# Patient Record
Sex: Male | Born: 1945 | Race: White | Hispanic: No | Marital: Single | Smoking: Former smoker
Health system: Southern US, Community
[De-identification: ages and names within clinical notes are randomized; demographics above are authoritative.]

## PROBLEM LIST (undated history)

## (undated) DIAGNOSIS — K529 Noninfective gastroenteritis and colitis, unspecified: Secondary | ICD-10-CM

## (undated) DIAGNOSIS — N400 Enlarged prostate without lower urinary tract symptoms: Secondary | ICD-10-CM

## (undated) DIAGNOSIS — E785 Hyperlipidemia, unspecified: Secondary | ICD-10-CM

## (undated) DIAGNOSIS — G4730 Sleep apnea, unspecified: Secondary | ICD-10-CM

## (undated) DIAGNOSIS — C61 Malignant neoplasm of prostate: Secondary | ICD-10-CM

## (undated) DIAGNOSIS — M1990 Unspecified osteoarthritis, unspecified site: Secondary | ICD-10-CM

## (undated) DIAGNOSIS — R972 Elevated prostate specific antigen [PSA]: Secondary | ICD-10-CM

## (undated) DIAGNOSIS — K56609 Unspecified intestinal obstruction, unspecified as to partial versus complete obstruction: Secondary | ICD-10-CM

## (undated) DIAGNOSIS — H9193 Unspecified hearing loss, bilateral: Secondary | ICD-10-CM

## (undated) DIAGNOSIS — Z87891 Personal history of nicotine dependence: Secondary | ICD-10-CM

## (undated) HISTORY — DX: Benign prostatic hyperplasia without lower urinary tract symptoms: N40.0

## (undated) HISTORY — DX: Sleep apnea, unspecified: G47.30

## (undated) HISTORY — PX: ANKLE SURGERY: SHX546

## (undated) HISTORY — DX: Hyperlipidemia, unspecified: E78.5

## (undated) HISTORY — DX: Malignant neoplasm of prostate: C61

## (undated) HISTORY — DX: Noninfective gastroenteritis and colitis, unspecified: K52.9

## (undated) HISTORY — DX: Elevated prostate specific antigen (PSA): R97.20

## (undated) HISTORY — DX: Unspecified osteoarthritis, unspecified site: M19.90

## (undated) HISTORY — DX: Unspecified intestinal obstruction, unspecified as to partial versus complete obstruction: K56.609

## (undated) HISTORY — DX: Personal history of nicotine dependence: Z87.891

---

## 1978-08-13 HISTORY — PX: COLON RESECTION: SHX5231

## 1979-08-14 HISTORY — PX: APPENDECTOMY: SHX54

## 2008-11-15 LAB — PULMONARY FUNCTION TEST

## 2010-08-13 HISTORY — PX: KNEE ARTHROSCOPY W/ MENISCAL REPAIR: SHX1877

## 2012-08-12 ENCOUNTER — Telehealth: Payer: Self-pay | Admitting: Internal Medicine

## 2012-08-12 NOTE — Telephone Encounter (Signed)
Mr Foot wife is a pt of your. He has [employer]. Would like to know if you would take him as a patient.  No major health issues

## 2012-08-14 NOTE — Telephone Encounter (Signed)
Yes, I will accept as patient

## 2012-08-20 ENCOUNTER — Encounter: Payer: Self-pay | Admitting: Internal Medicine

## 2012-08-20 ENCOUNTER — Ambulatory Visit (INDEPENDENT_AMBULATORY_CARE_PROVIDER_SITE_OTHER): Payer: Medicare Other | Admitting: Internal Medicine

## 2012-08-20 VITALS — BP 136/84 | HR 64 | Temp 97.7°F | Ht 69.5 in | Wt 217.0 lb

## 2012-08-20 DIAGNOSIS — M79609 Pain in unspecified limb: Secondary | ICD-10-CM

## 2012-08-20 DIAGNOSIS — M79604 Pain in right leg: Secondary | ICD-10-CM

## 2012-08-20 DIAGNOSIS — R7303 Prediabetes: Secondary | ICD-10-CM | POA: Insufficient documentation

## 2012-08-20 DIAGNOSIS — E785 Hyperlipidemia, unspecified: Secondary | ICD-10-CM

## 2012-08-20 DIAGNOSIS — L299 Pruritus, unspecified: Secondary | ICD-10-CM

## 2012-08-20 DIAGNOSIS — Z23 Encounter for immunization: Secondary | ICD-10-CM

## 2012-08-20 DIAGNOSIS — K529 Noninfective gastroenteritis and colitis, unspecified: Secondary | ICD-10-CM

## 2012-08-20 DIAGNOSIS — R197 Diarrhea, unspecified: Secondary | ICD-10-CM

## 2012-08-20 DIAGNOSIS — R7309 Other abnormal glucose: Secondary | ICD-10-CM

## 2012-08-20 DIAGNOSIS — L259 Unspecified contact dermatitis, unspecified cause: Secondary | ICD-10-CM

## 2012-08-20 DIAGNOSIS — L239 Allergic contact dermatitis, unspecified cause: Secondary | ICD-10-CM

## 2012-08-20 DIAGNOSIS — R739 Hyperglycemia, unspecified: Secondary | ICD-10-CM

## 2012-08-20 MED ORDER — TRIAMCINOLONE ACETONIDE 0.1 % EX CREA: TOPICAL_CREAM | Freq: Two times a day (BID) | CUTANEOUS | Status: DC

## 2012-08-20 MED ORDER — COLESEVELAM HCL 625 MG PO TABS: 625.0000 mg | ORAL_TABLET | Freq: Two times a day (BID) | ORAL | Status: DC

## 2012-08-20 NOTE — Progress Notes (Signed)
Subjective:    Patient ID: Brett Tate, male    DOB: 07/21/1946, 67 y.o.   MRN: [id number]  HPI  67 year old white male with history of prediabetes, hypertriglyceridemia and chronic diarrhea to establish. Patient with recently moved back to [place] area after living in [place] for the last 43 years. Patient works as a Airline pilot in [employer].  Patient reports being seen by gastroenterologist while he lived in [place] in May of 2013 for chronic diarrhea. There was question of whether he might have inflammatory bowel disease. He reports undergoing EGD and colonoscopy. Patient unsure of his diagnosis. He continues to have chronic loose stools. He denies any bloody stools.  He has history of hypertriglyceridemia. He infrequently takes his medication-fenofibrate.  He complains of pruritic rash on his right flank. He's had for the last 6 weeks. Patient also complains of intermittent pain lateral aspect of right thigh. He denies any injury or trauma.  Review of Systems  Constitutional: Negative for activity change, appetite change and unexpected weight change.  Eyes: Negative for visual disturbance.  Respiratory: Negative for cough, chest tightness and shortness of breath.   Cardiovascular: Negative for chest pain.  Genitourinary: Negative for difficulty urinating.  Neurological: Negative for headaches.  Gastrointestinal: Negative for heartburn melena or hematochezia Psych: Negative for depression or anxiety Endo:  Negative for sexual dysfunction  Past Medical History  Diagnosis Date  . Chronic diarrhea   . Dyslipidemia   . Osteoarthritis   . History of tobacco use     History   Social History  . Marital Status: Married    Spouse Name: N/A    Number of Children: N/A  . Years of Education: N/A   Occupational History  . Professor     Corporate treasurer   Social History Main Topics  . Smoking status: Former Games developer  . Smokeless tobacco: Not on file  .  Alcohol Use: No  . Drug Use: No  . Sexually Active:    Other Topics Concern  . Not on file   Social History Narrative  . No narrative on file    Past Surgical History  Procedure Date  . Appendectomy 1981    Also had intestinal tumor removed    Family History  Problem Relation Age of Onset  . Arthritis    . Cancer Paternal Grandfather     colon  . Heart disease Maternal Grandfather   . Stroke      No Known Allergies  Current Outpatient Prescriptions on File Prior to Visit  Medication Sig Dispense Refill  . colesevelam (WELCHOL) 625 MG tablet Take 1 tablet (625 mg total) by mouth 2 (two) times daily with a meal.  28 tablet  0    BP 136/84  Pulse 64  Temp 97.7 F (36.5 C) (Oral)  Ht 5' 9.5" (1.765 m)  Wt 217 lb (98.431 kg)  BMI 31.59 kg/m2        Objective:   Physical Exam  Constitutional: He is oriented to person, place, and time. He appears well-developed and well-nourished.  HENT:  Head: Normocephalic and atraumatic.  Right Ear: External ear normal.  Left Ear: External ear normal.  Mouth/Throat: Oropharynx is clear and moist.  Eyes: Conjunctivae normal and EOM are normal. Pupils are equal, round, and reactive to light.  Neck: Neck supple. No thyromegaly present.       No carotid bruit  Cardiovascular: Normal rate, regular rhythm and normal heart sounds.   No murmur heard. Pulmonary/Chest: Effort normal  and breath sounds normal. He has no wheezes.  Abdominal: Soft. Bowel sounds are normal. He exhibits no mass. There is no tenderness.  Musculoskeletal: He exhibits no edema.  Lymphadenopathy:    He has no cervical adenopathy.  Neurological: He is alert and oriented to person, place, and time. No cranial nerve deficit.  Skin:       Erythematous patch over right rib/flank  Psychiatric: He has a normal mood and affect. His behavior is normal.          Assessment & Plan:

## 2012-08-20 NOTE — Assessment & Plan Note (Signed)
Patient experiencing intermittent pain lateral aspect of right thigh. I suspect pain related to iliotibial band. I recommended stretching exercises. Patient advised to use over-the-counter NSAIDs sparingly.

## 2012-08-20 NOTE — Assessment & Plan Note (Signed)
Obtain screening lipid panel.  Discontinue fenofibrate

## 2012-08-20 NOTE — Assessment & Plan Note (Signed)
Patient reports history of chronic diarrhea. He was seen by gastroenterologist while he was living in [place]. He underwent EGD and colonoscopy in May of 2013. Obtain copy of records. Check CBCD and sed rate.  Trial of welchol for diarrhea.

## 2012-08-20 NOTE — Assessment & Plan Note (Signed)
Patient has erythematous patch over right ribs/flank area. I suspect allergic dermatitis. Patient to try triamcinolone cream twice a day for 2 weeks. Use Zyrtec 10 mg once daily for pruritus.

## 2012-08-20 NOTE — Addendum Note (Signed)
Addended by: Alfred Levins D on: 08/20/2012 05:30 PM   Modules accepted: Orders

## 2012-08-20 NOTE — Assessment & Plan Note (Signed)
Monitor A1c 

## 2012-08-20 NOTE — Patient Instructions (Addendum)
Use over the zyrtec 10 mg once daily Please provide copies of your medical records from your gastroenterologist

## 2012-08-21 ENCOUNTER — Other Ambulatory Visit (INDEPENDENT_AMBULATORY_CARE_PROVIDER_SITE_OTHER): Payer: Medicare Other

## 2012-08-21 DIAGNOSIS — N400 Enlarged prostate without lower urinary tract symptoms: Secondary | ICD-10-CM

## 2012-08-21 DIAGNOSIS — R7309 Other abnormal glucose: Secondary | ICD-10-CM

## 2012-08-21 DIAGNOSIS — R197 Diarrhea, unspecified: Secondary | ICD-10-CM

## 2012-08-21 DIAGNOSIS — K529 Noninfective gastroenteritis and colitis, unspecified: Secondary | ICD-10-CM

## 2012-08-21 DIAGNOSIS — L299 Pruritus, unspecified: Secondary | ICD-10-CM

## 2012-08-21 DIAGNOSIS — R739 Hyperglycemia, unspecified: Secondary | ICD-10-CM

## 2012-08-21 DIAGNOSIS — E785 Hyperlipidemia, unspecified: Secondary | ICD-10-CM

## 2012-08-21 LAB — HEPATIC FUNCTION PANEL
ALT: 25 U/L (ref 0–53)
AST: 18 U/L (ref 0–37)
Albumin: 3.1 g/dL — ABNORMAL LOW (ref 3.5–5.2)
Alkaline Phosphatase: 60 U/L (ref 39–117)

## 2012-08-21 LAB — LIPID PANEL
HDL: 23.9 mg/dL — ABNORMAL LOW (ref >39.00)
Total CHOL/HDL Ratio: 5

## 2012-08-21 LAB — CBC WITH DIFFERENTIAL/PLATELET
Basophils Absolute: 0 10*3/uL (ref 0.0–0.1)
Basophils Relative: 0.5 % (ref 0.0–3.0)
Eosinophils Relative: 5.1 % — ABNORMAL HIGH (ref 0.0–5.0)
HCT: 34.4 % — ABNORMAL LOW (ref 39.0–52.0)
Hemoglobin: 11 g/dL — ABNORMAL LOW (ref 13.0–17.0)
Lymphocytes Relative: 14.2 % (ref 12.0–46.0)
Monocytes Relative: 7.1 % (ref 3.0–12.0)
Neutro Abs: 5.7 10*3/uL (ref 1.4–7.7)
RBC: 4.61 Mil/uL (ref 4.22–5.81)
RDW: 19.1 % — ABNORMAL HIGH (ref 11.5–14.6)
WBC: 7.8 10*3/uL (ref 4.5–10.5)

## 2012-08-21 LAB — BASIC METABOLIC PANEL
Calcium: 8.2 mg/dL — ABNORMAL LOW (ref 8.4–10.5)
GFR: 94.7 mL/min (ref >60.00)
Potassium: 3.7 mEq/L (ref 3.5–5.1)
Sodium: 138 mEq/L (ref 135–145)

## 2012-08-21 LAB — SEDIMENTATION RATE: Sed Rate: 42 mm/hr — ABNORMAL HIGH (ref 0–22)

## 2012-08-21 LAB — TSH: TSH: 1.56 u[IU]/mL (ref 0.35–5.50)

## 2012-08-27 ENCOUNTER — Other Ambulatory Visit: Payer: Self-pay | Admitting: Internal Medicine

## 2012-08-27 DIAGNOSIS — R972 Elevated prostate specific antigen [PSA]: Secondary | ICD-10-CM

## 2012-08-28 ENCOUNTER — Encounter: Payer: Self-pay | Admitting: Internal Medicine

## 2012-08-28 ENCOUNTER — Ambulatory Visit (INDEPENDENT_AMBULATORY_CARE_PROVIDER_SITE_OTHER): Payer: Medicare Other | Admitting: Internal Medicine

## 2012-08-28 VITALS — BP 130/82 | Temp 98.1°F | Wt 214.0 lb

## 2012-08-28 DIAGNOSIS — R197 Diarrhea, unspecified: Secondary | ICD-10-CM

## 2012-08-28 DIAGNOSIS — R972 Elevated prostate specific antigen [PSA]: Secondary | ICD-10-CM

## 2012-08-28 DIAGNOSIS — R109 Unspecified abdominal pain: Secondary | ICD-10-CM

## 2012-08-28 DIAGNOSIS — K529 Noninfective gastroenteritis and colitis, unspecified: Secondary | ICD-10-CM

## 2012-08-28 MED ORDER — COLESEVELAM HCL 625 MG PO TABS: 625.0000 mg | ORAL_TABLET | Freq: Two times a day (BID) | ORAL | Status: DC

## 2012-08-28 MED ORDER — DICYCLOMINE HCL 10 MG PO CAPS: 10.0000 mg | ORAL_CAPSULE | Freq: Three times a day (TID) | ORAL | Status: DC | PRN

## 2012-08-28 NOTE — Patient Instructions (Addendum)
Please complete the following lab tests before your next follow up appointment: BMET, A1c - 790.29 CBCD - 285.9

## 2012-08-28 NOTE — Assessment & Plan Note (Signed)
Improved with WelChol. Still awaiting records from previous gastroenterologist.

## 2012-08-28 NOTE — Assessment & Plan Note (Signed)
Refer to urology for further evaluation.   Lab Results  Component Value Date   PSA 5.19* 08/21/2012

## 2012-08-28 NOTE — Assessment & Plan Note (Signed)
Patient reports history of episodic abdominal cramping. He is had extensive workup with gastroenterologist when he was living in [place]. He reports work was unremarkable. Patient may have IBS. Use Bentyl 10 mg 3 times a day as needed.

## 2012-08-28 NOTE — Progress Notes (Signed)
  Subjective:    Patient ID: Brett Tate, male    DOB: [birth year], 67 y.o.   MRN: [id number]  HPI  67 year old white male with prediabetes and chronic diarrhea for followup. Patient reports since starting WelChol his loose stools have significantly improved. We are still awaiting records from his previous gastroenterologist. However patient reports that previous EGD, colonoscopy, and capsule endoscopy was unrevealing.  Patient complains today of intermittent abdominal cramping. He has "spells" episodically.  Review of Systems Negative for diarrhea.   Past Medical History  Diagnosis Date  . Chronic diarrhea   . Dyslipidemia   . Osteoarthritis   . History of tobacco use     History   Social History  . Marital Status: Married    Spouse Name: N/A    Number of Children: N/A  . Years of Education: N/A   Occupational History  . Professor     Corporate treasurer   Social History Main Topics  . Smoking status: Former Games developer  . Smokeless tobacco: Not on file  . Alcohol Use: No  . Drug Use: No  . Sexually Active:    Other Topics Concern  . Not on file   Social History Narrative  . No narrative on file    Past Surgical History  Procedure Date  . Appendectomy 1981    Also had intestinal tumor removed    Family History  Problem Relation Age of Onset  . Arthritis    . Cancer Paternal Grandfather     colon  . Heart disease Maternal Grandfather   . Stroke      No Known Allergies  Current Outpatient Prescriptions on File Prior to Visit  Medication Sig Dispense Refill  . colesevelam (WELCHOL) 625 MG tablet Take 1 tablet (625 mg total) by mouth 2 (two) times daily with a meal.  180 tablet  1  . ibuprofen (ADVIL,MOTRIN) 200 MG tablet Take 200 mg by mouth every 6 (six) hours as needed.      . triamcinolone cream (KENALOG) 0.1 % Apply topically 2 (two) times daily. Apply twice daily for 2 weeks to affected area  454 g  0  . dicyclomine (BENTYL) 10 MG capsule Take 1  capsule (10 mg total) by mouth 3 (three) times daily as needed.  90 capsule  1    BP 130/82  Temp 98.1 F (36.7 C) (Oral)  Wt 214 lb (97.07 kg)       Objective:   Physical Exam  Constitutional: He appears well-developed and well-nourished.  Cardiovascular: Normal rate, regular rhythm and normal heart sounds.   Pulmonary/Chest: Effort normal and breath sounds normal.  Abdominal: Soft. Bowel sounds are normal. He exhibits no mass. There is no tenderness.  Skin: Skin is warm and dry.  Psychiatric: He has a normal mood and affect. His behavior is normal.          Assessment & Plan:

## 2012-09-03 ENCOUNTER — Ambulatory Visit (INDEPENDENT_AMBULATORY_CARE_PROVIDER_SITE_OTHER): Payer: Medicare Other | Admitting: Internal Medicine

## 2012-09-03 ENCOUNTER — Encounter: Payer: Self-pay | Admitting: Internal Medicine

## 2012-09-03 VITALS — BP 136/84 | Temp 97.8°F | Wt 215.0 lb

## 2012-09-03 DIAGNOSIS — K529 Noninfective gastroenteritis and colitis, unspecified: Secondary | ICD-10-CM

## 2012-09-03 DIAGNOSIS — K5090 Crohn's disease, unspecified, without complications: Secondary | ICD-10-CM

## 2012-09-03 DIAGNOSIS — R197 Diarrhea, unspecified: Secondary | ICD-10-CM

## 2012-09-03 DIAGNOSIS — L989 Disorder of the skin and subcutaneous tissue, unspecified: Secondary | ICD-10-CM

## 2012-09-03 DIAGNOSIS — M19049 Primary osteoarthritis, unspecified hand: Secondary | ICD-10-CM

## 2012-09-03 MED ORDER — DICLOFENAC SODIUM 1 % TD GEL: 2.0000 g | Freq: Four times a day (QID) | TRANSDERMAL | Status: DC

## 2012-09-03 NOTE — Assessment & Plan Note (Signed)
Review of his records from [place] suggests patient has probable inflammatory bowel disease. Colon biopsies showed active colitis. Patient reports his symptoms are fairly mild. Arrange followup with gastroenterologist.

## 2012-09-03 NOTE — Progress Notes (Signed)
  Subjective:    Patient ID: Brett Tate, male    DOB: 1945-10-27, 67 y.o.   MRN: [id number]  HPI  67 year old white male with history of chronic diarrhea for followup. We finally received medical records from his physicians in [place].  Patient underwent extensive workup for chronic diarrhea and anemia. Patient completed colonoscopy on 10/08/2011. Random biopsies of colon showed chronic active colitis. Pathology report notes differential diagnosis would include early inflammatory bowel disease or possibly prolonged acute self-limited colitis. His recent medical record on 01/04/2012 notes diagnosis of Crohn's disease.  Patient feels his symptoms are mild. His diarrhea has significantly improved with starting WelChol. He was prescribed 6-mercaptopurine in the past but had to discontinue due to significant side effects.  Patient complains of skin lesion left side of his nose. He noticed a couple months ago. He has a red spot.  It is not tender or painful. It has not changed significantly in size.  Patient also complains of painful knuckles especially in his first metacarpal joint of left hand.  Review of Systems Negative for history of skin cancer  Past Medical History  Diagnosis Date  . Chronic diarrhea   . Dyslipidemia   . Osteoarthritis   . History of tobacco use     History   Social History  . Marital Status: Married    Spouse Name: N/A    Number of Children: N/A  . Years of Education: N/A   Occupational History  . Professor     Corporate treasurer   Social History Main Topics  . Smoking status: Former Games developer  . Smokeless tobacco: Not on file  . Alcohol Use: No  . Drug Use: No  . Sexually Active:    Other Topics Concern  . Not on file   Social History Narrative  . No narrative on file    Past Surgical History  Procedure Date  . Appendectomy 1981    Also had intestinal tumor removed    Family History  Problem Relation Age of Onset  . Arthritis    .  Cancer Paternal Grandfather     colon  . Heart disease Maternal Grandfather   . Stroke      No Known Allergies  Current Outpatient Prescriptions on File Prior to Visit  Medication Sig Dispense Refill  . colesevelam (WELCHOL) 625 MG tablet Take 1 tablet (625 mg total) by mouth 2 (two) times daily with a meal.  180 tablet  1  . dicyclomine (BENTYL) 10 MG capsule Take 1 capsule (10 mg total) by mouth 3 (three) times daily as needed.  90 capsule  1  . ibuprofen (ADVIL,MOTRIN) 200 MG tablet Take 200 mg by mouth every 6 (six) hours as needed.      . triamcinolone cream (KENALOG) 0.1 % Apply topically 2 (two) times daily. Apply twice daily for 2 weeks to affected area  454 g  0    BP 136/84  Temp 97.8 F (36.6 C) (Oral)  Wt 215 lb (97.523 kg)       Objective:   Physical Exam  Constitutional: He appears well-developed and well-nourished.  HENT:  Head: Normocephalic and atraumatic.  Cardiovascular: Normal rate, regular rhythm and normal heart sounds.   Pulmonary/Chest: Effort normal and breath sounds normal. He has no wheezes.  Skin:       2-3 mm red spot on left outer nose          Assessment & Plan:

## 2012-09-03 NOTE — Assessment & Plan Note (Signed)
Patient has painful knuckle of left hand (index finger).  It is unclear whether his symptoms related to inflammatory bowel disease. Trial of topical diclofenac gel for now.

## 2012-09-03 NOTE — Assessment & Plan Note (Signed)
Patient has 2-3 mm red area on left side of nose. He first noticed 2 months ago. Refer to dermatology for further evaluation.

## 2012-09-05 ENCOUNTER — Encounter: Payer: Self-pay | Admitting: Gastroenterology

## 2012-09-24 ENCOUNTER — Encounter: Payer: Self-pay | Admitting: Gastroenterology

## 2012-09-24 ENCOUNTER — Ambulatory Visit (INDEPENDENT_AMBULATORY_CARE_PROVIDER_SITE_OTHER): Payer: Medicare Other | Admitting: Gastroenterology

## 2012-09-24 VITALS — BP 132/80 | HR 68 | Ht 69.5 in | Wt 215.0 lb

## 2012-09-24 DIAGNOSIS — R197 Diarrhea, unspecified: Secondary | ICD-10-CM

## 2012-09-24 DIAGNOSIS — K529 Noninfective gastroenteritis and colitis, unspecified: Secondary | ICD-10-CM

## 2012-09-24 NOTE — Patient Instructions (Addendum)
We will get records from your primary gastroenterologist in [place] (recent year of office notes), Dr. Lorenda Peck. Will put in our reminder system for colonoscopy for FH of colon cancer in 09/2016. You drink a lot of caffeine, this may contribute to your chronic diarrhea. Cutting back may help. Call with any new GI issues.

## 2012-09-24 NOTE — Progress Notes (Signed)
HPI: This is a   very pleasant 67 year old man whom I am meeting for the first time today.  Moved from [place] about 6 months.  He has had multiple gastrointestinal procedures, tests in [place] dating back at least to 2011:  In 2013, Feb he underwent double balloon enteroscopy at [facility] of both the upper and lower GI tract. The upper tract was normal deep into the jejunum and ileum. The lower tract port states and area of edema in the right colon coinciding to previous remote surgery, this was biopsied and there also random biopsies taken from his colon. Both of these sets of biopsies suggested chronic, active inflammation colitis.  In 10/2009 he underwent capsule endoscopy with some edematous small bowel and capsule retention, it appears the capsule eventually passed through without need for surgery.  In February 2011 he underwent colonoscopy and upper endoscopy. Colonoscopy describes some diverticulosis but was otherwise normal. The upper endoscopy found some mild gastritis. Biopsies were taken showing no H. pylori, duodenal biopsies were all normal. The indication for this procedure was iron deficiency anemia.  Labs last month  showed mild microcytic anemia with a hemoglobin of 11, elevated sedimentation rate at 40, otherwise essentially normal labs from a GI perspective  His primary gastroenterologist Dr. Ernesto Rutherford at [facility] in [place].  Has welchol, which works for one to two days.  Takes bentyl for spasm pains.  Never sees blood in his stool.  Weight has been relatively stable in past year or so.  Drinks 1-2 pots of coffee a day.  Drinks 5-6 glasses of water per day.   Review of systems: Pertinent positive and negative review of systems were noted in the above HPI section. Complete review of systems was performed and was otherwise normal.    Past Medical History  Diagnosis Date  . Chronic diarrhea   . Dyslipidemia   . Osteoarthritis   . History of  tobacco use   . Sleep apnea     CPAP Machine     Past Surgical History  Procedure Laterality Date  . Appendectomy  1981    Also had intestinal tumor removed  . Colon resection  1980    Current Outpatient Prescriptions  Medication Sig Dispense Refill  . AMBULATORY NON FORMULARY MEDICATION CPAP MACHINE      . colesevelam (WELCHOL) 625 MG tablet Take 1 tablet (625 mg total) by mouth 2 (two) times daily with a meal.  180 tablet  1  . diclofenac sodium (VOLTAREN) 1 % GEL Apply 2 g topically 4 (four) times daily.  2 Tube  3  . dicyclomine (BENTYL) 10 MG capsule Take 1 capsule (10 mg total) by mouth 3 (three) times daily as needed.  90 capsule  1  . ibuprofen (ADVIL,MOTRIN) 200 MG tablet Take 200 mg by mouth every 6 (six) hours as needed.      . triamcinolone cream (KENALOG) 0.1 % Apply topically 2 (two) times daily. Apply twice daily for 2 weeks to affected area  454 g  0   No current facility-administered medications for this visit.    Allergies as of 09/24/2012  . (No Known Allergies)    Family History  Problem Relation Age of Onset  . Arthritis    . Colon cancer Maternal Grandfather   . Heart disease Maternal Grandfather   . Irritable bowel syndrome Mother     History   Social History  . Marital Status: Married    Spouse Name: N/A  Number of Children: N/A  . Years of Education: N/A   Occupational History  . Professor     Corporate treasurer   Social History Main Topics  . Smoking status: Former Games developer  . Smokeless tobacco: Never Used  . Alcohol Use: No  . Drug Use: No  . Sexually Active: Not on file   Other Topics Concern  . Not on file   Social History Narrative   Daily caffeine        Physical Exam: BP 132/80  Pulse 68  Ht 5' 9.5" (1.765 m)  Wt 215 lb (97.523 kg)  BMI 31.31 kg/m2 Constitutional: generally well-appearing Psychiatric: alert and oriented x3 Eyes: extraocular movements intact Mouth: oral pharynx moist, no lesions Neck: supple no  lymphadenopathy Cardiovascular: heart regular rate and rhythm Lungs: clear to auscultation bilaterally Abdomen: soft, nontender, nondistended, no obvious ascites, no peritoneal signs, normal bowel sounds Extremities: no lower extremity edema bilaterally Skin: no lesions on visible extremities Talks with pressured speech   Assessment and plan: 67 y.o. male with  chronic diarrhea  he has undergone an exhaustive workup in the past 2-3 years in [place]. I had most of the documents associated with that workup, see that summary above. It sounds like his primary gastroenterologist settled on the diagnosis of Crohn's disease however I'm not really sure that he truly has Crohn's disease based on my review. His diarrhea is easily treated with even a single WelChol which he takes only on a when necessary basis and advised to continue taking on an as-needed basis. One pill will constipated for as much as a day or 2. That is unusual for Crohn's disease. He takes antispasm medicines on an as-needed basis as well. I think he should simply continue that regimen. I don't think anything serious has been overlooked. Perhaps what he truly has is microscopic colitis however since even a single WelChol is helping I wouldn't change management at this point if I knew for certain of the case. His last colonoscopy was one year ago and he did not need another one for at least 4 years, family history of colon cancer. He does drink 1-2 pots of coffee a day and explained to him that this can certainly contribute to chronic loose stools. He tells me he has never heard before and has never tried cutting back.  He'll simply return to see me on an as-needed basis.

## 2012-10-03 ENCOUNTER — Other Ambulatory Visit: Payer: Self-pay | Admitting: Urology

## 2012-10-03 DIAGNOSIS — R972 Elevated prostate specific antigen [PSA]: Secondary | ICD-10-CM

## 2012-10-13 ENCOUNTER — Other Ambulatory Visit (HOSPITAL_COMMUNITY): Payer: Medicare Other

## 2012-10-13 ENCOUNTER — Ambulatory Visit (HOSPITAL_COMMUNITY)
Admission: RE | Admit: 2012-10-13 | Discharge: 2012-10-13 | Disposition: A | Discharge status: Home / Self Care | Payer: Medicare Other | Source: Ambulatory Visit | Attending: Urology | Admitting: Urology

## 2012-10-13 DIAGNOSIS — R972 Elevated prostate specific antigen [PSA]: Secondary | ICD-10-CM

## 2012-10-13 LAB — CREATININE, SERUM
Creatinine, Ser: 0.87 mg/dL (ref 0.50–1.35)
GFR calc Af Amer: >90 mL/min (ref >90)
GFR calc non Af Amer: 89 mL/min — ABNORMAL LOW (ref >90)

## 2012-10-13 MED ORDER — GADOBENATE DIMEGLUMINE 529 MG/ML IV SOLN
20.0000 mL | Freq: Once | INTRAVENOUS | Status: AC | PRN
  Administered 2012-10-13: 20 mL via INTRAVENOUS

## 2012-11-16 DIAGNOSIS — C61 Malignant neoplasm of prostate: Secondary | ICD-10-CM

## 2012-11-16 HISTORY — PX: PROSTATE BIOPSY: SHX241

## 2012-11-16 HISTORY — DX: Malignant neoplasm of prostate: C61

## 2012-11-23 ENCOUNTER — Other Ambulatory Visit: Payer: Self-pay | Admitting: Internal Medicine

## 2013-03-11 ENCOUNTER — Other Ambulatory Visit: Payer: Self-pay | Admitting: Internal Medicine

## 2013-05-20 ENCOUNTER — Ambulatory Visit
Admission: RE | Admit: 2013-05-20 | Discharge: 2013-05-20 | Disposition: A | Discharge status: Home / Self Care | Payer: Medicare Other | Source: Ambulatory Visit | Attending: Radiation Oncology | Admitting: Radiation Oncology

## 2013-05-20 ENCOUNTER — Encounter: Payer: Self-pay | Admitting: Radiation Oncology

## 2013-05-20 ENCOUNTER — Ambulatory Visit: Payer: Medicare Other

## 2013-05-20 ENCOUNTER — Ambulatory Visit: Payer: Medicare Other | Admitting: Radiation Oncology

## 2013-05-20 VITALS — BP 141/84 | HR 65 | Temp 98.1°F | Wt 227.3 lb

## 2013-05-20 DIAGNOSIS — Z79899 Other long term (current) drug therapy: Secondary | ICD-10-CM | POA: Insufficient documentation

## 2013-05-20 DIAGNOSIS — C61 Malignant neoplasm of prostate: Secondary | ICD-10-CM | POA: Insufficient documentation

## 2013-05-20 DIAGNOSIS — E785 Hyperlipidemia, unspecified: Secondary | ICD-10-CM | POA: Insufficient documentation

## 2013-05-20 DIAGNOSIS — Z87891 Personal history of nicotine dependence: Secondary | ICD-10-CM | POA: Insufficient documentation

## 2013-05-20 HISTORY — DX: Unspecified hearing loss, bilateral: H91.93

## 2013-05-20 NOTE — Progress Notes (Signed)
Radiation Oncology         [phone] ________________________________  Initial outpatient Consultation  Name: Brett Tate. MRN: [id number]  Date: 05/20/2013  DOB: Jun 26, 1946  WU:[id number] Brett Pais, DO  Brett Pippin, MD   REFERRING PHYSICIAN: Bjorn Pippin, MD  DIAGNOSIS: 67 y.o. gentleman with stage T1c adenocarcinoma of the prostate with a Gleason's score of 3+3 and a PSA of 5.19  HISTORY OF PRESENT ILLNESS::Brett Tate. is a 67 y.o. gentleman.  He was noted to have an elevated PSA of 5.19 by his primary care physician, Dr. Artist Tate.  Accordingly, he was referred for evaluation in urology by Dr. Annabell Tate on 10/02/12,  digital rectal examination was performed at that time revealing no nodule.  The patient proceeded to transrectal ultrasound with 12 biopsies of the prostate on 11/26/12.  The prostate volume measured 50 cc.  Out of 12 core biopsies, 3 were positive.  The maximum Gleason score was 3+3, and this was seen in 20% of the Rt Lat base, 80% of the Rt lat mid, and 80% of a 2nd core from the Rt Lat mid.  The patient reviewed the biopsy results with his urologist and he has kindly been referred today for discussion of potential radiation treatment options.  PREVIOUS RADIATION THERAPY: No  PAST MEDICAL HISTORY:  has a past medical history of Chronic diarrhea; Dyslipidemia; Osteoarthritis; History of tobacco use; Sleep apnea; Prostate cancer (11/16/2012); Hypertrophy of prostate without urinary obstruction and other lower urinary tract symptoms (LUTS); Elevated prostate specific antigen (PSA); Unspecified intestinal obstruction; and Hearing difficulty of both ears.    PAST SURGICAL HISTORY: Past Surgical History  Procedure Laterality Date  . Appendectomy  1981    Also had intestinal tumor removed  . Colon resection  1980  . Ankle surgery    . Prostate biopsy  11/16/2012  . Knee arthroscopy w/ meniscal repair  2012    left knee    FAMILY HISTORY: family history includes Arthritis  in an other family member; Colon cancer in his maternal grandfather; Heart disease in his maternal grandfather; Irritable bowel syndrome in his mother. There is no history of Cancer.  SOCIAL HISTORY:  reports that he quit smoking about 4 years ago. His smoking use included Cigarettes. He has a 86 pack-year smoking history. He has never used smokeless tobacco. He reports that he does not drink alcohol or use illicit drugs.  ALLERGIES: Review of patient's allergies indicates no known allergies.  MEDICATIONS:  Current Outpatient Prescriptions  Medication Sig Dispense Refill  . AMBULATORY NON FORMULARY MEDICATION CPAP MACHINE      . colesevelam (WELCHOL) 625 MG tablet Take 1 tablet (625 mg total) by mouth 2 (two) times daily with a meal.  180 tablet  1  . diclofenac sodium (VOLTAREN) 1 % GEL Apply 2 g topically 4 (four) times daily.  2 Tube  3  . dicyclomine (BENTYL) 10 MG capsule TAKE ONE CAPSULE 3 TIMES A DAY AS NEEDED  90 capsule  1  . ibuprofen (ADVIL,MOTRIN) 200 MG tablet Take 200 mg by mouth every 6 (six) hours as needed.      . milk thistle 175 MG tablet Take 175 mg by mouth daily.       No current facility-administered medications for this encounter.    REVIEW OF SYSTEMS:  A 15 point review of systems is documented in the electronic medical record. This was obtained by the nursing staff. However, I reviewed this with the patient to discuss relevant findings  and make appropriate changes.  A comprehensive review of systems was negative..  The patient completed an IPSS and IIEF questionnaire.  His IPSS score was 2 indicating mild urinary outflow obstructive symptoms.  He indicated that his erectile function is able to complete sexual activity on most attempts.   PHYSICAL EXAM: This patient is in no acute distress.  He is alert and oriented.   weight is 227 lb 4.8 oz (103.103 kg). His temperature is 98.1 F (36.7 C). His blood pressure is 141/84 and his pulse is 65.  He exhibits no respiratory  distress or labored breathing.  He appears neurologically intact.  His mood is pleasant.  His affect is appropriate.  Please note the digital rectal exam findings described above.  KPS = 100  LABORATORY DATA:  Lab Results  Component Value Date   WBC 7.8 08/21/2012   HGB 11.0* 08/21/2012   HCT 34.4* 08/21/2012   MCV 74.7* 08/21/2012   PLT 354.0 08/21/2012   Lab Results  Component Value Date   NA 138 08/21/2012   K 3.7 08/21/2012   CL 105 08/21/2012   CO2 27 08/21/2012   Lab Results  Component Value Date   ALT 25 08/21/2012   AST 18 08/21/2012   ALKPHOS 60 08/21/2012   BILITOT 0.5 08/21/2012     RADIOGRAPHY: No results found.    IMPRESSION: This gentleman is a 67 y.o. 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  His T-Stage, Gleason's Score, and PSA put him into the favorable risk group.  Accordingly he is eligible for a variety of potential treatment options including active surveillance, prostatectomy, IMRT, or brachytherapy.  PLAN:  Today I reviewed the findings and workup thus far.  We discussed the natural history of prostate cancer.  We reviewed the the implications of T-stage, Gleason's Score, and PSA on decision-making and outcomes in prostate cancer.  We discussed radiation treatment in the management of prostate cancer with regard to the logistics and delivery of external beam radiation treatment as well as the logistics and delivery of prostate brachytherapy.  We compared and contrasted each of these approaches and also compared these against prostatectomy.  The patient expressed interest in prostate brachytherapy.  I filled out a patient counseling form for him with relevant treatment diagrams and we retained a copy for our records.   The patient would like to proceed with active surveillance initially, with an interest in possible prostate brachytherapy in the event that he decides to pursue treatment.  I will share my findings with Dr.  Annabell Tate.  I enjoyed meeting with him today, and will look forward to participating in the care of this very nice gentleman.    ------------------------------------------------  Brett Tate. Kathrynn Running, M.D.

## 2013-05-20 NOTE — Progress Notes (Signed)
GU Location of Tumor / Histology: adenocarcinoma of prostate  If Prostate Cancer, Gleason Score is (3 + 3) and PSA is (4.57)  Patient referred to Dr. [name] by Dr. [name] (PCP) for an elevated PSA 5.19.  Biopsies of prostate (if applicable) revealed:     Past/Anticipated interventions by urology, if any: follow up in 3 months with PSA should he desire to continue surveillance  Past/Anticipated interventions by medical oncology, if any: N/A  Weight changes, if any: no recent weight loss  Bowel/Bladder complaints, if any: voiding without complaints   Nausea/Vomiting, if any: None noted  Pain issues, if any:  Denies bone pain  SAFETY ISSUES:  Prior radiation? NO  Pacemaker/ICD? NO  Possible current pregnancy? N/A  Is the patient on methotrexate? NO  Current Complaints / other details:  67 year old male. Married. Prostate volume 50 cc. Originally patient wanted to consider focal cryotherapy but, understands this is an investigational approach but [name] gave him the name of a urologist who does this. Also, patient interested in brachytherapy.

## 2013-05-20 NOTE — Progress Notes (Signed)
Please see the Nurse Progress Note in the MD Initial Consult Encounter for this patient. 

## 2013-08-14 DIAGNOSIS — C61 Malignant neoplasm of prostate: Secondary | ICD-10-CM | POA: Diagnosis not present

## 2013-08-20 DIAGNOSIS — N400 Enlarged prostate without lower urinary tract symptoms: Secondary | ICD-10-CM | POA: Diagnosis not present

## 2013-08-20 DIAGNOSIS — C61 Malignant neoplasm of prostate: Secondary | ICD-10-CM | POA: Diagnosis not present

## 2013-08-20 DIAGNOSIS — R972 Elevated prostate specific antigen [PSA]: Secondary | ICD-10-CM | POA: Diagnosis not present

## 2013-08-24 ENCOUNTER — Telehealth: Payer: Self-pay | Admitting: Internal Medicine

## 2013-08-24 NOTE — Telephone Encounter (Signed)
Pt would like to switch from Dr. Shawna Orleans to Dr. Maudie Mercury.

## 2013-08-24 NOTE — Telephone Encounter (Signed)
Ok with me 

## 2013-08-25 NOTE — Telephone Encounter (Signed)
Ok, will need >67 yo/medicare new patient visit at 11:15 on a  Monday. Ok to schedule wife on same day if needed - 30 min appointment.

## 2013-08-27 ENCOUNTER — Ambulatory Visit (INDEPENDENT_AMBULATORY_CARE_PROVIDER_SITE_OTHER): Payer: BC Managed Care – PPO | Admitting: Family Medicine

## 2013-08-27 ENCOUNTER — Encounter: Payer: Self-pay | Admitting: Family Medicine

## 2013-08-27 VITALS — BP 124/82 | Temp 98.6°F | Wt 222.0 lb

## 2013-08-27 DIAGNOSIS — L239 Allergic contact dermatitis, unspecified cause: Secondary | ICD-10-CM

## 2013-08-27 DIAGNOSIS — J309 Allergic rhinitis, unspecified: Secondary | ICD-10-CM

## 2013-08-27 DIAGNOSIS — L989 Disorder of the skin and subcutaneous tissue, unspecified: Secondary | ICD-10-CM

## 2013-08-27 DIAGNOSIS — L259 Unspecified contact dermatitis, unspecified cause: Secondary | ICD-10-CM

## 2013-08-27 MED ORDER — TRIAMCINOLONE ACETONIDE 0.1 % EX CREA: TOPICAL_CREAM | Freq: Two times a day (BID) | CUTANEOUS | Status: DC

## 2013-08-27 MED ORDER — FLUTICASONE PROPIONATE 50 MCG/ACT NA SUSP: 2.0000 | Freq: Every day | NASAL | Status: DC

## 2013-08-27 NOTE — Progress Notes (Signed)
Pre visit review using our clinic review tool, if applicable. No additional management support is needed unless otherwise documented below in the visit note. 

## 2013-08-27 NOTE — Patient Instructions (Signed)
-  take zyrtec daily  -flonase 2 sprays each side for 1 month, then 1 spray each side  Aquaphor for lesion on nose

## 2013-08-27 NOTE — Progress Notes (Signed)
Chief Complaint  Patient presents with  . Allergies    HPI:  -started: a year ago -symptoms:nasal congestion, sneezing, watery eyes at times -denies:fever, SOB, NVD, tooth pain -has tried: zyrtec, used to take flonase which really helped but out of this -sick contacts/travel/risks: denies flu exposurer or Ebola risks -Hx of: allergies  Lesion in nose: R nares Only for a few days, but has had before here and on other nares, heals on its own  Wants refill on skin medication  ROS: See pertinent positives and negatives per HPI.  Past Medical History  Diagnosis Date  . Chronic diarrhea   . Dyslipidemia   . Osteoarthritis   . History of tobacco use   . Sleep apnea     CPAP Machine   . Prostate cancer 11/16/2012    T1c Gleason 6 prostate cancer  . Hypertrophy of prostate without urinary obstruction and other lower urinary tract symptoms (LUTS)   . Elevated prostate specific antigen (PSA)   . Unspecified intestinal obstruction   . Hearing difficulty of both ears     wears hearing aids    Past Surgical History  Procedure Laterality Date  . Appendectomy  1981    Also had intestinal tumor removed  . Colon resection  1980  . Ankle surgery    . Prostate biopsy  11/16/2012  . Knee arthroscopy w/ meniscal repair  2012    left knee    Family History  Problem Relation Age of Onset  . Arthritis    . Colon cancer Maternal Grandfather   . Heart disease Maternal Grandfather   . Irritable bowel syndrome Mother   . Cancer Neg Hx     History   Social History  . Marital Status: Married    Spouse Name: N/A    Number of Children: 1  . Years of Education: N/A   Occupational History  . Professor     Nurse, learning disability   Social History Main Topics  . Smoking status: Former Smoker -- 2.00 packs/day for 43 years    Types: Cigarettes    Quit date: 08/13/2008  . Smokeless tobacco: Never Used  . Alcohol Use: No  . Drug Use: No  . Sexual Activity: None   Other Topics Concern   . None   Social History Narrative   Daily caffeine     Current outpatient prescriptions:AMBULATORY NON FORMULARY MEDICATION, CPAP MACHINE, Disp: , Rfl: ;  colesevelam (WELCHOL) 625 MG tablet, Take 1 tablet (625 mg total) by mouth 2 (two) times daily with a meal., Disp: 180 tablet, Rfl: 1;  diclofenac sodium (VOLTAREN) 1 % GEL, Apply 2 g topically 4 (four) times daily., Disp: 2 Tube, Rfl: 3;  dicyclomine (BENTYL) 10 MG capsule, TAKE ONE CAPSULE 3 TIMES A DAY AS NEEDED, Disp: 90 capsule, Rfl: 1 ibuprofen (ADVIL,MOTRIN) 200 MG tablet, Take 200 mg by mouth every 6 (six) hours as needed., Disp: , Rfl: ;  milk thistle 175 MG tablet, Take 175 mg by mouth daily., Disp: , Rfl: ;  fluticasone (FLONASE) 50 MCG/ACT nasal spray, Place 2 sprays into both nostrils daily., Disp: 16 g, Rfl: 6;  triamcinolone cream (KENALOG) 0.1 %, Apply topically 2 (two) times daily. Apply twice daily for 2 weeks to affected area, Disp: 454 g, Rfl: 0  EXAM:  Filed Vitals:   08/27/13 1404  BP: 124/82  Temp: 98.6 F (37 C)    Body mass index is 32.32 kg/(m^2).  GENERAL: vitals reviewed and listed above, alert, oriented,  appears well hydrated and in no acute distress  HEENT: atraumatic, conjunttiva clear, no obvious abnormalities on inspection of external nose and ears, small area of irritation R nares, normal appearance of ear canals and TMs, clear nasal congestion with boggy turbinates, mild post oropharyngeal erythema with PND, no tonsillar edema or exudate, no sinus TTP  NECK: no obvious masses on inspection  LUNGS: clear to auscultation bilaterally, no wheezes, rales or rhonchi, good air movement  CV: HRRR, no peripheral edema  MS: moves all extremities without noticeable abnormality  PSYCH: pleasant and cooperative, no obvious depression or anxiety  ASSESSMENT AND PLAN:  Discussed the following assessment and plan:  Allergic rhinitis - Plan: fluticasone (FLONASE) 50 MCG/ACT nasal spray  Benign skin lesion  of nose  Allergic dermatitis - Plan: triamcinolone cream (KENALOG) 0.1 %  -add INS for allergies and discussed proper use -aquaphor for lesion and advised to see ENT if not healing -refilled on triamcinilone for skin -follow up with PCP in 1 month for routine follow up    Patient Instructions  -take zyrtec daily  -flonase 2 sprays each side for 1 month, then 1 spray each side  Aquaphor for lesion on nose     Bo Rogue R.

## 2013-11-09 ENCOUNTER — Ambulatory Visit: Payer: BC Managed Care – PPO | Admitting: Family Medicine

## 2013-11-09 ENCOUNTER — Ambulatory Visit (INDEPENDENT_AMBULATORY_CARE_PROVIDER_SITE_OTHER): Payer: BC Managed Care – PPO | Admitting: Family Medicine

## 2013-11-09 ENCOUNTER — Encounter: Payer: Self-pay | Admitting: Family Medicine

## 2013-11-09 VITALS — BP 120/78 | Temp 98.6°F | Ht 69.5 in | Wt 220.0 lb

## 2013-11-09 DIAGNOSIS — R7303 Prediabetes: Secondary | ICD-10-CM

## 2013-11-09 DIAGNOSIS — R7309 Other abnormal glucose: Secondary | ICD-10-CM

## 2013-11-09 DIAGNOSIS — Z23 Encounter for immunization: Secondary | ICD-10-CM

## 2013-11-09 DIAGNOSIS — E785 Hyperlipidemia, unspecified: Secondary | ICD-10-CM

## 2013-11-09 DIAGNOSIS — Z0000 Encounter for general adult medical examination without abnormal findings: Secondary | ICD-10-CM

## 2013-11-09 LAB — COMPREHENSIVE METABOLIC PANEL
ALBUMIN: 4 g/dL (ref 3.5–5.2)
ALT: 30 U/L (ref 0–53)
AST: 20 U/L (ref 0–37)
Alkaline Phosphatase: 73 U/L (ref 39–117)
BILIRUBIN TOTAL: 0.7 mg/dL (ref 0.3–1.2)
BUN: 11 mg/dL (ref 6–23)
CO2: 27 meq/L (ref 19–32)
Calcium: 8.9 mg/dL (ref 8.4–10.5)
Chloride: 102 mEq/L (ref 96–112)
Creatinine, Ser: 0.8 mg/dL (ref 0.4–1.5)
GFR: 96.95 mL/min (ref >60.00)
GLUCOSE: 100 mg/dL — AB (ref 70–99)
POTASSIUM: 3.6 meq/L (ref 3.5–5.1)
SODIUM: 137 meq/L (ref 135–145)
TOTAL PROTEIN: 7.4 g/dL (ref 6.0–8.3)

## 2013-11-09 LAB — LIPID PANEL
Cholesterol: 155 mg/dL (ref 0–200)
HDL: 30.3 mg/dL — ABNORMAL LOW (ref >39.00)
LDL Cholesterol: 60 mg/dL (ref 0–99)
Total CHOL/HDL Ratio: 5
Triglycerides: 323 mg/dL — ABNORMAL HIGH (ref 0.0–149.0)
VLDL: 64.6 mg/dL — ABNORMAL HIGH (ref 0.0–40.0)

## 2013-11-09 LAB — HEMOGLOBIN A1C: HEMOGLOBIN A1C: 5.9 % (ref 4.6–6.5)

## 2013-11-09 MED ORDER — DICYCLOMINE HCL 10 MG PO CAPS: ORAL_CAPSULE | ORAL | Status: DC

## 2013-11-09 NOTE — Progress Notes (Signed)
Chief Complaint  Patient presents with  . Establish Care    HPI:  Brett Tate. is here to establish care. Transfer from Dr. Shawna Orleans. Wants to check basic labs.  Has the following chronic problems and concerns today:  Patient Active Problem List   Diagnosis Date Noted  . Malignant neoplasm of prostate 05/20/2013  . Skin lesion of face 09/03/2012  . Hand arthritis 09/03/2012  . Abdominal cramping 08/28/2012  . Elevated PSA 08/28/2012  . Allergic dermatitis 08/20/2012  . Dyslipidemia 08/20/2012  . Prediabetes 08/20/2012  . Chronic diarrhea 08/20/2012  . Right leg pain 08/20/2012   Stage 1 Prostate cancer: -reports followed by [facility]  Health Maintenance:  ROS: See pertinent positives and negatives per HPI.  Past Medical History  Diagnosis Date  . Chronic diarrhea   . Dyslipidemia   . Osteoarthritis   . History of tobacco use   . Sleep apnea     CPAP Machine   . Prostate cancer 11/16/2012    T1c Gleason 6 prostate cancer  . Hypertrophy of prostate without urinary obstruction and other lower urinary tract symptoms (LUTS)   . Elevated prostate specific antigen (PSA)   . Unspecified intestinal obstruction   . Hearing difficulty of both ears     wears hearing aids    Family History  Problem Relation Age of Onset  . Arthritis    . Colon cancer Maternal Grandfather   . Heart disease Maternal Grandfather   . Irritable bowel syndrome Mother   . Cancer Neg Hx     History   Social History  . Marital Status: Married    Spouse Name: N/A    Number of Children: 1  . Years of Education: N/A   Occupational History  . Professor     Nurse, learning disability   Social History Main Topics  . Smoking status: Former Smoker -- 2.00 packs/day for 43 years    Types: Cigarettes    Quit date: 08/13/2008  . Smokeless tobacco: Never Used  . Alcohol Use: No  . Drug Use: No  . Sexual Activity: None   Other Topics Concern  . None   Social History Narrative   Daily caffeine       Work or School: business, teaches, used to [employer] Situation: lives with wife      Spiritual Beliefs: none      Lifestyle: regular exercise and healthy diet             Current outpatient prescriptions:AMBULATORY NON FORMULARY MEDICATION, CPAP MACHINE, Disp: , Rfl: ;  Ascorbic Acid (VITAMIN C) 1000 MG tablet, Take 1,000 mg by mouth daily., Disp: , Rfl: ;  milk thistle 175 MG tablet, Take 175 mg by mouth daily., Disp: , Rfl: ;  triamcinolone cream (KENALOG) 0.1 %, Apply topically 2 (two) times daily. Apply twice daily for 2 weeks to affected area, Disp: 454 g, Rfl: 0 cetirizine (ZYRTEC) 10 MG tablet, Take 10 mg by mouth daily., Disp: , Rfl: ;  diclofenac sodium (VOLTAREN) 1 % GEL, Apply 2 g topically 4 (four) times daily., Disp: 2 Tube, Rfl: 3;  dicyclomine (BENTYL) 10 MG capsule, TAKE ONE CAPSULE 3 TIMES A DAY AS NEEDED, Disp: 90 capsule, Rfl: 1;  fluticasone (FLONASE) 50 MCG/ACT nasal spray, Place 2 sprays into both nostrils daily., Disp: 16 g, Rfl: 6 ibuprofen (ADVIL,MOTRIN) 200 MG tablet, Take 200 mg by mouth every 6 (six) hours as needed., Disp: , Rfl:  EXAM:  Filed Vitals:   11/09/13 1118  BP: 120/78  Temp: 98.6 F (37 C)    Body mass index is 32.03 kg/(m^2).  GENERAL: vitals reviewed and listed above, alert, oriented, appears well hydrated and in no acute distress  HEENT: atraumatic, conjunttiva clear, no obvious abnormalities on inspection of external nose and ears  NECK: no obvious masses on inspection  LUNGS: clear to auscultation bilaterally, no wheezes, rales or rhonchi, good air movement  CV: HRRR, no peripheral edema  MS: moves all extremities without noticeable abnormality  PSYCH: pleasant and cooperative, no obvious depression or anxiety  ASSESSMENT AND PLAN:  Discussed the following assessment and plan:  Visit for preventive health examination - Plan: Lipid Panel, Hemoglobin A1c, CMP  Prediabetes  Dyslipidemia -We  reviewed the PMH, PSH, FH, SH, Meds and Allergies. -We provided refills for any medications we will prescribe as needed. -We addressed current concerns per orders and patient instructions. -We have asked for records for pertinent exams, studies, vaccines and notes from previous providers. -We have advised patient to follow up per instructions below. -FASTING labs -tdap   -Patient advised to return or notify a doctor immediately if symptoms worsen or persist or new concerns arise.  Patient Instructions  -We have ordered labs or studies at this visit. It can take up to 1-2 weeks for results and processing. We will contact you with instructions IF your results are abnormal. Normal results will be released to your [facility]. If you have not heard from [place] or can not find your results in [facility] in 2 weeks please contact our office.  -PLEASE SIGN UP FOR MYCHART TODAY   We recommend the following healthy lifestyle measures: - eat a healthy diet consisting of lots of vegetables, fruits, beans, nuts, seeds, healthy meats such as white chicken and fish and whole grains.  - avoid fried foods, fast food, processed foods, sodas, red meet and other fattening foods.  - get a least 150 minutes of aerobic exercise per week.   Follow up in: 1 year or as needed      KIM, Jarrett Soho R.

## 2013-11-09 NOTE — Addendum Note (Signed)
Addended by: Colleen Can on: 11/09/2013 11:47 AM   Modules accepted: Orders

## 2013-11-09 NOTE — Patient Instructions (Signed)
-  We have ordered labs or studies at this visit. It can take up to 1-2 weeks for results and processing. We will contact you with instructions IF your results are abnormal. Normal results will be released to your MYCHART. If you have not heard from us or can not find your results in MYCHART in 2 weeks please contact our office.  -PLEASE SIGN UP FOR MYCHART TODAY   We recommend the following healthy lifestyle measures: - eat a healthy diet consisting of lots of vegetables, fruits, beans, nuts, seeds, healthy meats such as white chicken and fish and whole grains.  - avoid fried foods, fast food, processed foods, sodas, red meet and other fattening foods.  - get a least 150 minutes of aerobic exercise per week.   Follow up in: 1 year or as needed  

## 2013-11-09 NOTE — Progress Notes (Signed)
Pre visit review using our clinic review tool, if applicable. No additional management support is needed unless otherwise documented below in the visit note. 

## 2013-11-09 NOTE — Addendum Note (Signed)
Addended by: Lucretia Kern on: 11/09/2013 11:44 AM   Modules accepted: Orders

## 2014-04-07 ENCOUNTER — Other Ambulatory Visit: Payer: Self-pay | Admitting: Family Medicine

## 2014-04-07 NOTE — Telephone Encounter (Signed)
Needs appointment in next few months. Refill to appointment.

## 2014-05-14 ENCOUNTER — Other Ambulatory Visit: Payer: Self-pay | Admitting: Family Medicine

## 2014-06-07 DIAGNOSIS — C61 Malignant neoplasm of prostate: Secondary | ICD-10-CM | POA: Diagnosis not present

## 2014-06-12 ENCOUNTER — Other Ambulatory Visit: Payer: Self-pay | Admitting: Family Medicine

## 2014-06-17 NOTE — Telephone Encounter (Signed)
I think he was seeing Dr. Elwyn Reach) for hi intestinal issues and was supposed to contact him if any issues. Ok to refill for #90) - but if has had a change in symptoms, worsneing of symptoms advise he see his gastroenterologist.

## 2014-06-21 NOTE — Telephone Encounter (Signed)
I left a message for the pt to return my call. 

## 2014-06-22 ENCOUNTER — Emergency Department (HOSPITAL_BASED_OUTPATIENT_CLINIC_OR_DEPARTMENT_OTHER): Payer: BC Managed Care – PPO

## 2014-06-22 ENCOUNTER — Emergency Department (HOSPITAL_BASED_OUTPATIENT_CLINIC_OR_DEPARTMENT_OTHER)
Admission: EM | Admit: 2014-06-22 | Discharge: 2014-06-22 | Disposition: A | Discharge status: Home / Self Care | Payer: BC Managed Care – PPO | Attending: Emergency Medicine | Admitting: Emergency Medicine

## 2014-06-22 ENCOUNTER — Ambulatory Visit (INDEPENDENT_AMBULATORY_CARE_PROVIDER_SITE_OTHER): Payer: BC Managed Care – PPO | Admitting: Family Medicine

## 2014-06-22 ENCOUNTER — Telehealth: Payer: Self-pay | Admitting: *Deleted

## 2014-06-22 ENCOUNTER — Encounter (HOSPITAL_BASED_OUTPATIENT_CLINIC_OR_DEPARTMENT_OTHER): Payer: Self-pay | Admitting: *Deleted

## 2014-06-22 ENCOUNTER — Encounter: Payer: Self-pay | Admitting: Family Medicine

## 2014-06-22 VITALS — BP 120/82 | HR 88 | Temp 97.9°F | Ht 69.5 in | Wt 222.7 lb

## 2014-06-22 DIAGNOSIS — H9193 Unspecified hearing loss, bilateral: Secondary | ICD-10-CM | POA: Insufficient documentation

## 2014-06-22 DIAGNOSIS — Z8639 Personal history of other endocrine, nutritional and metabolic disease: Secondary | ICD-10-CM | POA: Insufficient documentation

## 2014-06-22 DIAGNOSIS — R519 Headache, unspecified: Secondary | ICD-10-CM

## 2014-06-22 DIAGNOSIS — Z8739 Personal history of other diseases of the musculoskeletal system and connective tissue: Secondary | ICD-10-CM | POA: Diagnosis not present

## 2014-06-22 DIAGNOSIS — G4730 Sleep apnea, unspecified: Secondary | ICD-10-CM | POA: Insufficient documentation

## 2014-06-22 DIAGNOSIS — Z79899 Other long term (current) drug therapy: Secondary | ICD-10-CM | POA: Diagnosis not present

## 2014-06-22 DIAGNOSIS — Z9981 Dependence on supplemental oxygen: Secondary | ICD-10-CM | POA: Insufficient documentation

## 2014-06-22 DIAGNOSIS — Z7951 Long term (current) use of inhaled steroids: Secondary | ICD-10-CM | POA: Diagnosis not present

## 2014-06-22 DIAGNOSIS — R51 Headache: Secondary | ICD-10-CM | POA: Insufficient documentation

## 2014-06-22 DIAGNOSIS — Z8546 Personal history of malignant neoplasm of prostate: Secondary | ICD-10-CM | POA: Diagnosis not present

## 2014-06-22 DIAGNOSIS — Z8719 Personal history of other diseases of the digestive system: Secondary | ICD-10-CM | POA: Diagnosis not present

## 2014-06-22 DIAGNOSIS — Z87448 Personal history of other diseases of urinary system: Secondary | ICD-10-CM | POA: Diagnosis not present

## 2014-06-22 DIAGNOSIS — Z87891 Personal history of nicotine dependence: Secondary | ICD-10-CM | POA: Insufficient documentation

## 2014-06-22 LAB — CBC WITH DIFFERENTIAL/PLATELET
BASOS ABS: 0 10*3/uL (ref 0.0–0.1)
Basophils Relative: 0 % (ref 0–1)
EOS PCT: 3 % (ref 0–5)
Eosinophils Absolute: 0.3 10*3/uL (ref 0.0–0.7)
HCT: 43.7 % (ref 39.0–52.0)
Hemoglobin: 15.2 g/dL (ref 13.0–17.0)
LYMPHS PCT: 15 % (ref 12–46)
Lymphs Abs: 1.5 10*3/uL (ref 0.7–4.0)
MCH: 31.1 pg (ref 26.0–34.0)
MCHC: 34.8 g/dL (ref 30.0–36.0)
MCV: 89.5 fL (ref 78.0–100.0)
Monocytes Absolute: 0.7 10*3/uL (ref 0.1–1.0)
Monocytes Relative: 8 % (ref 3–12)
NEUTROS PCT: 74 % (ref 43–77)
Neutro Abs: 7.1 10*3/uL (ref 1.7–7.7)
PLATELETS: 253 10*3/uL (ref 150–400)
RBC: 4.88 MIL/uL (ref 4.22–5.81)
RDW: 12.8 % (ref 11.5–15.5)
WBC: 9.7 10*3/uL (ref 4.0–10.5)

## 2014-06-22 LAB — BASIC METABOLIC PANEL
ANION GAP: 11 (ref 5–15)
BUN: 10 mg/dL (ref 6–23)
CO2: 24 meq/L (ref 19–32)
Calcium: 9.5 mg/dL (ref 8.4–10.5)
Chloride: 105 mEq/L (ref 96–112)
Creatinine, Ser: 0.9 mg/dL (ref 0.50–1.35)
GFR calc Af Amer: >90 mL/min (ref >90)
GFR, EST NON AFRICAN AMERICAN: 86 mL/min — AB (ref >90)
Glucose, Bld: 105 mg/dL — ABNORMAL HIGH (ref 70–99)
POTASSIUM: 4 meq/L (ref 3.7–5.3)
SODIUM: 140 meq/L (ref 137–147)

## 2014-06-22 LAB — SEDIMENTATION RATE: Sed Rate: 25 mm/hr — ABNORMAL HIGH (ref 0–16)

## 2014-06-22 MED ORDER — FENTANYL CITRATE 0.05 MG/ML IJ SOLN: 50.0000 ug | INTRAMUSCULAR | Status: DC | PRN

## 2014-06-22 MED ORDER — METOCLOPRAMIDE HCL 5 MG/ML IJ SOLN
10.0000 mg | Freq: Once | INTRAMUSCULAR | Status: AC
  Administered 2014-06-22: 10 mg via INTRAVENOUS
  Filled 2014-06-22: qty 2

## 2014-06-22 MED ORDER — SODIUM CHLORIDE 0.9 % IV BOLUS (SEPSIS)
500.0000 mL | Freq: Once | INTRAVENOUS | Status: AC
  Administered 2014-06-22: 500 mL via INTRAVENOUS

## 2014-06-22 MED ORDER — IOHEXOL 350 MG/ML SOLN
100.0000 mL | Freq: Once | INTRAVENOUS | Status: AC | PRN
  Administered 2014-06-22: 100 mL via INTRAVENOUS

## 2014-06-22 MED ORDER — KETOROLAC TROMETHAMINE 15 MG/ML IJ SOLN
15.0000 mg | Freq: Once | INTRAMUSCULAR | Status: AC
  Administered 2014-06-22: 15 mg via INTRAVENOUS
  Filled 2014-06-22: qty 1

## 2014-06-22 MED ORDER — METOCLOPRAMIDE HCL 10 MG PO TABS: 10.0000 mg | ORAL_TABLET | Freq: Three times a day (TID) | ORAL | Status: DC | PRN

## 2014-06-22 MED ORDER — DIPHENHYDRAMINE HCL 50 MG/ML IJ SOLN
25.0000 mg | Freq: Once | INTRAMUSCULAR | Status: AC
  Administered 2014-06-22: 25 mg via INTRAVENOUS
  Filled 2014-06-22: qty 1

## 2014-06-22 NOTE — Telephone Encounter (Signed)
Patient informed of the message below and the refill was sent to his pharmacy on 11/6 by Dr Maudie Mercury.

## 2014-06-22 NOTE — Telephone Encounter (Signed)
Per Dr Maudie Mercury I called the [facility] in [facility] at [phone] and advised Brett Tate. in the ER the pt is on his way over by car, driving himself to be evaluated for a new onset of severe right-sided headache for the past two days and he needs a CT and/or [place].

## 2014-06-22 NOTE — Progress Notes (Signed)
HPI:  Acute visit for:  1) Headache: -started 3 days ago, reports has never had headaches before and this is the worst headache of his life -severe headache R temporal region and R neck - worse when breathing in -so bad last night he could not sleep, R neck, temporal region -oxycodone and ibuprofen did not relieve the pain -denies sinus congestion, fevers, nausea, ear pain, sinus pain -reports on Saturday had fuzzy vision briefly   ROS: See pertinent positives and negatives per HPI.  Past Medical History  Diagnosis Date  . Chronic diarrhea   . Dyslipidemia   . Osteoarthritis   . History of tobacco use   . Sleep apnea     CPAP Machine   . Prostate cancer 11/16/2012    T1c Gleason 6 prostate cancer  . Hypertrophy of prostate without urinary obstruction and other lower urinary tract symptoms (LUTS)   . Elevated prostate specific antigen (PSA)   . Unspecified intestinal obstruction   . Hearing difficulty of both ears     wears hearing aids    Past Surgical History  Procedure Laterality Date  . Appendectomy  1981    Also had intestinal tumor removed  . Colon resection  1980    fibroid  . Ankle surgery    . Prostate biopsy  11/16/2012  . Knee arthroscopy w/ meniscal repair  2012    left knee    Family History  Problem Relation Age of Onset  . Arthritis    . Colon cancer Maternal Grandfather   . Heart disease Maternal Grandfather   . Irritable bowel syndrome Mother   . Cancer Neg Hx     History   Social History  . Marital Status: Married    Spouse Name: N/A    Number of Children: 1  . Years of Education: N/A   Occupational History  . Professor     Nurse, learning disability   Social History Main Topics  . Smoking status: Former Smoker -- 2.00 packs/day for 43 years    Types: Cigarettes    Quit date: 08/13/2008  . Smokeless tobacco: Never Used  . Alcohol Use: No  . Drug Use: No  . Sexual Activity: None   Other Topics Concern  . None   Social History  Narrative   Daily caffeine       Work or School: business, teaches, used to [employer] Situation: lives with wife      Spiritual Beliefs: none      Lifestyle: regular exercise and healthy diet             Current outpatient prescriptions: AMBULATORY NON FORMULARY MEDICATION, CPAP MACHINE, Disp: , Rfl: ;  cetirizine (ZYRTEC) 10 MG tablet, Take 10 mg by mouth daily., Disp: , Rfl: ;  dicyclomine (BENTYL) 10 MG capsule, TAKE 1 CAPSULE BY MOUTH 3 TIMES DAILY AS NEEDED, Disp: 90 capsule, Rfl: 0;  fluticasone (FLONASE) 50 MCG/ACT nasal spray, Place 2 sprays into both nostrils daily., Disp: 16 g, Rfl: 6 ibuprofen (ADVIL,MOTRIN) 200 MG tablet, Take 200 mg by mouth every 6 (six) hours as needed., Disp: , Rfl: ;  milk thistle 175 MG tablet, Take 175 mg by mouth daily., Disp: , Rfl: ;  NON FORMULARY, Apricot seed, Disp: , Rfl: ;  NON FORMULARY, Indole-3-carbinole, Disp: , Rfl:   EXAM:  Filed Vitals:   06/22/14 1605  BP: 120/82  Pulse: 88  Temp: 97.9 F (36.6 C)    Body  mass index is 32.43 kg/(m^2).  GENERAL: vitals reviewed and listed above, alert, oriented, appears well hydrated and in no acute distress  HEENT: atraumatic, conjunttiva clear, PERRLa, vision grossly intact, wears glasses, no obvious abnormalities on inspection of external nose and ears, no temporal art TTP or bruit, no carotid bruit, sig TTP of R mastoid process - but this is not where his headache is  NECK: no obvious masses on inspection  LUNGS: clear to auscultation bilaterally, no wheezes, rales or rhonchi, good air movement  CV: HRRR, no peripheral edema  MS: moves all extremities without noticeable abnormality  NEURO: PERRLA, CN II-XII grosly intact, mild droop of R side of mouth which he thinks may be chronic, finger to nose normal, normal gait, normal strength in upper ext bilaterally  PSYCH: pleasant and cooperative, no obvious depression or anxiety  ASSESSMENT AND PLAN:  Discussed the following  assessment and plan:  Severe headache  -we discussed possible serious and likely etiologies, workup and treatment, treatment risks and return precautions - while this may be muscular, given the severe nature, acute onset, age and ? Visual changes intercranial pathology, dissection must be considered -after this discussion, Brett Tate opted for evaluation in ED for expedient evaluation given severity of symptoms, acute onset - we advised that he not drive himself to the ED, but he insisted and opted to go to [place] - advised assistant to notify ED staff -of course, we advised Brett Tate  to return or notify a doctor immediately if symptoms worsen or persist or new concerns arise.  .  -Patient advised to return or notify a doctor immediately if symptoms worsen or persist or new concerns arise.  There are no Patient Instructions on file for this visit.   Colin Benton R.

## 2014-06-22 NOTE — ED Notes (Signed)
MD at bedside. 

## 2014-06-22 NOTE — Discharge Instructions (Signed)
If you were given medicines take as directed.  If you are on coumadin or contraceptives realize their levels and effectiveness is altered by many different medicines.  If you have any reaction (rash, tongues swelling, other) to the medicines stop taking and see a physician.   Take benadryl with reglan for headache to see if it helps.  Please follow up as directed and return to the ER or see a physician for new or worsening symptoms.  Thank you. Filed Vitals:   06/22/14 1703 06/22/14 1913  BP: 154/96 138/95  Pulse: 85 73  Temp: 98.1 F (36.7 C)   TempSrc: Oral Oral  Resp: 18 18  Height: 5\' 11"  (1.803 m)   Weight: 222 lb (100.699 kg)   SpO2: 96% 97%

## 2014-06-22 NOTE — ED Notes (Signed)
Patient transported to CT 

## 2014-06-22 NOTE — ED Notes (Signed)
Pt discharged to home with family. NAD.  

## 2014-06-22 NOTE — ED Provider Notes (Signed)
CSN: [id number]     Arrival date & time 06/22/14  1658 History   First MD Initiated Contact with Patient 06/22/14 1724     Chief Complaint  Patient presents with  . Headache     (Consider location/radiation/quality/duration/timing/severity/associated sxs/prior Treatment) HPI Comments: 68 year old male with history of prediabetes, prostate cancer presents with headache for 4 days.  Patient had gradual onset headache and right-sided parietal temple region, no history of similar. No history of aneurysm however his mother died of an aneurysm in her brain. Patient feels the headache is right parietal and temporal region, no other significant radiation, intermittent episodes throughout the day multiple with improved episodes in between, brief increases in severity and then milder in between. No head injuries, no neck stiffness or fevers. Patient has been taking over-the-counter medicines and narcotics which temporarily improved the pain. Patient denies neuro symptoms  Patient is a 68 y.o. male presenting with headaches. The history is provided by the patient.  Headache Associated symptoms: no abdominal pain, no back pain, no congestion, no fever, no neck pain, no neck stiffness, no numbness and no vomiting     Past Medical History  Diagnosis Date  . Chronic diarrhea   . Dyslipidemia   . Osteoarthritis   . History of tobacco use   . Sleep apnea     CPAP Machine   . Prostate cancer 11/16/2012    T1c Gleason 6 prostate cancer  . Hypertrophy of prostate without urinary obstruction and other lower urinary tract symptoms (LUTS)   . Elevated prostate specific antigen (PSA)   . Unspecified intestinal obstruction   . Hearing difficulty of both ears     wears hearing aids   Past Surgical History  Procedure Laterality Date  . Appendectomy  1981    Also had intestinal tumor removed  . Colon resection  1980    fibroid  . Ankle surgery    . Prostate biopsy  11/16/2012  . Knee arthroscopy w/  meniscal repair  2012    left knee   Family History  Problem Relation Age of Onset  . Arthritis    . Colon cancer Maternal Grandfather   . Heart disease Maternal Grandfather   . Irritable bowel syndrome Mother   . Cancer Neg Hx    History  Substance Use Topics  . Smoking status: Former Smoker -- 2.00 packs/day for 43 years    Types: Cigarettes    Quit date: 08/13/2008  . Smokeless tobacco: Never Used  . Alcohol Use: No    Review of Systems  Constitutional: Negative for fever and chills.  HENT: Negative for congestion.   Eyes: Negative for visual disturbance.  Respiratory: Negative for shortness of breath.   Cardiovascular: Negative for chest pain.  Gastrointestinal: Negative for vomiting and abdominal pain.  Genitourinary: Negative for dysuria and flank pain.  Musculoskeletal: Negative for back pain, neck pain and neck stiffness.  Skin: Negative for rash.  Neurological: Positive for headaches. Negative for weakness, light-headedness and numbness.      Allergies  Review of patient's allergies indicates no known allergies.  Home Medications   Prior to Admission medications   Medication Sig Start Date End Date Taking? Authorizing Provider  AMBULATORY NON FORMULARY MEDICATION CPAP MACHINE    Historical Provider, MD  cetirizine (ZYRTEC) 10 MG tablet Take 10 mg by mouth daily.    Historical Provider, MD  dicyclomine (BENTYL) 10 MG capsule TAKE 1 CAPSULE BY MOUTH 3 TIMES DAILY AS NEEDED 06/18/14   Nickola Major  Kim, DO  fluticasone (FLONASE) 50 MCG/ACT nasal spray Place 2 sprays into both nostrils daily. 08/27/13   Lucretia Kern, DO  ibuprofen (ADVIL,MOTRIN) 200 MG tablet Take 200 mg by mouth every 6 (six) hours as needed.    Historical Provider, MD  metoCLOPramide (REGLAN) 10 MG tablet Take 1 tablet (10 mg total) by mouth every 8 (eight) hours as needed for nausea. 06/22/14   Mariea Clonts, MD  milk thistle 175 MG tablet Take 175 mg by mouth daily.    Historical Provider, MD  NON  FORMULARY Apricot seed    Historical Provider, MD  NON FORMULARY Indole-3-carbinole    Historical Provider, MD   BP 138/95 mmHg  Pulse 73  Temp(Src) 98.1 F (36.7 C) (Oral)  Resp 18  Ht 5\' 11"  (1.803 m)  Wt 222 lb (100.699 kg)  BMI 30.98 kg/m2  SpO2 97% Physical Exam  Constitutional: He is oriented to person, place, and time. He appears well-developed and well-nourished.  HENT:  Head: Normocephalic and atraumatic.  Mild right upper temple tenderness, no swelling or warmth  Eyes: Conjunctivae are normal. Right eye exhibits no discharge. Left eye exhibits no discharge.  Neck: Normal range of motion. Neck supple. No tracheal deviation present.  Cardiovascular: Normal rate and regular rhythm.   Pulmonary/Chest: Effort normal and breath sounds normal.  Abdominal: Soft. He exhibits no distension. There is no tenderness. There is no guarding.  Musculoskeletal: He exhibits no edema.  Neurological: He is alert and oriented to person, place, and time. He displays a negative Romberg sign. Coordination and gait normal. GCS eye subscore is 4. GCS verbal subscore is 5. GCS motor subscore is 6.  5+ strength in UE and LE with f/e at major joints. Sensation to palpation intact in UE and LE. CNs 2-12 grossly intact.  EOMFI.  PERRL.   Finger nose and coordination intact bilateral.   Visual fields intact to finger testing.neck supple no meningismus   Skin: Skin is warm. No rash noted.  Psychiatric: He has a normal mood and affect.  Nursing note and vitals reviewed.   ED Course  Procedures (including critical care time) Labs Review Labs Reviewed  SEDIMENTATION RATE - Abnormal; Notable for the following:    Sed Rate 25 (*)    All other components within normal limits  BASIC METABOLIC PANEL - Abnormal; Notable for the following:    Glucose, Bld 105 (*)    GFR calc non Af Amer 86 (*)    All other components within normal limits  CBC WITH DIFFERENTIAL    Imaging Review Ct Angio Head W/cm  &/or Wo Cm  06/22/2014   CLINICAL DATA:  Right-sided headache and dizziness. Clinical concern for aneurysm.  EXAM: CT ANGIOGRAPHY HEAD  TECHNIQUE: Multidetector CT imaging of the head was performed using the standard protocol during bolus administration of intravenous contrast. Multiplanar CT image reconstructions and MIPs were obtained to evaluate the vascular anatomy.  CONTRAST:  161mL OMNIPAQUE IOHEXOL 350 MG/ML SOLN  COMPARISON:  Head CT earlier same day  FINDINGS: Both internal carotid arteries are widely patent through the skullbase and siphon regions. The anterior and middle cerebral vessels are normal without proximal stenosis, aneurysm or vascular malformation. The right A1 segment is fenestrated, an incidental finding.  Both vertebral arteries are widely patent to the basilar. No basilar stenosis. Posterior circulation branch vessels are normal.  Major venous structures appear patent and normal.  Review of the MIP images confirms the above findings.  IMPRESSION: Normal intracranial  CT angiography.  No aneurysm.   Electronically Signed   By: Nelson Chimes M.D.   On: 06/22/2014 20:36   Ct Head Wo Contrast  06/22/2014   CLINICAL DATA:  Right-sided headache  EXAM: CT HEAD WITHOUT CONTRAST  TECHNIQUE: Contiguous axial images were obtained from the base of the skull through the vertex without intravenous contrast.  COMPARISON:  None.  FINDINGS: There is no evidence of mass effect, midline shift or extra-axial fluid collections. There is no evidence of a space-occupying lesion or intracranial hemorrhage. There is no evidence of a cortical-based area of acute infarction. 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  The ventricles and sulci are appropriate for the patient's age. The basal cisterns are patent.  Visualized portions of the orbits are unremarkable. The visualized portions of the paranasal sinuses and mastoid air cells are unremarkable.  The osseous structures are unremarkable.  IMPRESSION: No  acute intracranial pathology.   Electronically Signed   By: Kathreen Devoid   On: 06/22/2014 18:40     EKG Interpretation None      MDM   Final diagnoses:  Right temporal headache  Right-sided headache   Well-appearing male with new type of headache onset 4 days prior, no thunderclap sensation or sudden severe onset however patient concerned as he has never had this headache in the past. Patient multiple intermittent worsening episodes. Discussed differential diagnosis including vasculitis, neuralgia, other. Headache cocktail given in the ER. CT head results reviewed no acute findings. Sedimentation rate pending, discussed close outpatient follow-up with neurology.  Patient improved on recheck. CT head and CT angiography unremarkable, no aneurysm. Sedimentation rate unremarkable. Follow-up with primary Dr. And neurology outpatient. Patient improved in ER Results and differential diagnosis were discussed with the patient/parent/guardian. Close follow up outpatient was discussed, comfortable with the plan.   Medications  fentaNYL (SUBLIMAZE) injection 50 mcg (not administered)  metoCLOPramide (REGLAN) injection 10 mg (10 mg Intravenous Given 06/22/14 1852)  diphenhydrAMINE (BENADRYL) injection 25 mg (25 mg Intravenous Given 06/22/14 1850)  sodium chloride 0.9 % bolus 500 mL (0 mLs Intravenous Stopped 06/22/14 1952)  ketorolac (TORADOL) 15 MG/ML injection 15 mg (15 mg Intravenous Given 06/22/14 1952)  iohexol (OMNIPAQUE) 350 MG/ML injection 100 mL (100 mLs Intravenous Contrast Given 06/22/14 2014)    Filed Vitals:   06/22/14 1703 06/22/14 1913  BP: 154/96 138/95  Pulse: 85 73  Temp: 98.1 F (36.7 C)   TempSrc: Oral Oral  Resp: 18 18  Height: 5\' 11"  (1.803 m)   Weight: 222 lb (100.699 kg)   SpO2: 96% 97%    Final diagnoses:  Right temporal headache  Right-sided headache        Mariea Clonts, MD 06/22/14 2057

## 2014-06-22 NOTE — ED Notes (Signed)
Pain in his head x 4 days.

## 2014-06-22 NOTE — Progress Notes (Signed)
Pre visit review using our clinic review tool, if applicable. No additional management support is needed unless otherwise documented below in the visit note. 

## 2014-06-23 ENCOUNTER — Telehealth: Payer: Self-pay | Admitting: Family Medicine

## 2014-06-23 NOTE — Telephone Encounter (Signed)
[phone] (home)  Talked with pt. He is feeling better but wants to see neurologist and reported does not need referral at this time. Wants new cpap as current cpap not working as well and 68 years old per his report. Will have assistant fax orders.

## 2014-06-28 ENCOUNTER — Other Ambulatory Visit: Payer: Self-pay | Admitting: *Deleted

## 2014-06-28 DIAGNOSIS — G4733 Obstructive sleep apnea (adult) (pediatric): Secondary | ICD-10-CM

## 2014-07-13 ENCOUNTER — Other Ambulatory Visit: Payer: Self-pay | Admitting: Family Medicine

## 2014-07-13 NOTE — Telephone Encounter (Signed)
If using the same as in the past when evaluated by GI ok to refill. If symptoms worsening or new symptoms advise he schedule follow up with his gastroenterologist - per review OV notes from GI from 2014 advising continued prn welchol and bentyl and follow up if concerns.

## 2014-07-13 NOTE — Telephone Encounter (Signed)
Dr Kim-is this OK to refill? See note from the Rx approved on 11/6-does the pt need to contact GI Dr Ardis Hughs for refills?

## 2014-07-14 NOTE — Telephone Encounter (Signed)
I called the pt and he stated he takes this as needed and he is not worse.  Patient aware the Rx was sent to his pharmacy.

## 2014-07-14 NOTE — Telephone Encounter (Signed)
Brett Tate,  Did you refill? Call pt with message? Thanks!

## 2014-08-10 ENCOUNTER — Institutional Professional Consult (permissible substitution): Payer: BC Managed Care – PPO | Admitting: Pulmonary Disease

## 2014-08-19 ENCOUNTER — Encounter: Payer: Self-pay | Admitting: Pulmonary Disease

## 2014-08-27 ENCOUNTER — Ambulatory Visit (INDEPENDENT_AMBULATORY_CARE_PROVIDER_SITE_OTHER): Payer: BLUE CROSS/BLUE SHIELD | Admitting: Family Medicine

## 2014-08-27 ENCOUNTER — Encounter: Payer: Self-pay | Admitting: Family Medicine

## 2014-08-27 ENCOUNTER — Institutional Professional Consult (permissible substitution): Payer: BC Managed Care – PPO | Admitting: Pulmonary Disease

## 2014-08-27 ENCOUNTER — Encounter: Payer: Medicare Other | Admitting: Family Medicine

## 2014-08-27 VITALS — BP 130/84 | HR 74 | Temp 97.8°F | Ht 71.0 in | Wt 226.2 lb

## 2014-08-27 DIAGNOSIS — M79642 Pain in left hand: Secondary | ICD-10-CM

## 2014-08-27 NOTE — Progress Notes (Signed)
HPI:  Arthritis in hands: -has had it for many years -sig pain in L 2nd mcp joint; worse when wakes up then better after getting movement -has tried tylenol that helps a little -worsening over time -denies: weakness, numbness, swelling or redness, fevers, malaise, trauma, clicking, catching -uses this hand a lot for golf, typing, etc -R hand dominate  ROS: See pertinent positives and negatives per HPI.  Past Medical History  Diagnosis Date  . Chronic diarrhea   . Dyslipidemia   . Osteoarthritis   . History of tobacco use   . Sleep apnea     CPAP Machine   . Prostate cancer 11/16/2012    T1c Gleason 6 prostate cancer  . Hypertrophy of prostate without urinary obstruction and other lower urinary tract symptoms (LUTS)   . Elevated prostate specific antigen (PSA)   . Unspecified intestinal obstruction   . Hearing difficulty of both ears     wears hearing aids    Past Surgical History  Procedure Laterality Date  . Appendectomy  1981    Also had intestinal tumor removed  . Colon resection  1980    fibroid  . Ankle surgery    . Prostate biopsy  11/16/2012  . Knee arthroscopy w/ meniscal repair  2012    left knee    Family History  Problem Relation Age of Onset  . Arthritis    . Colon cancer Maternal Grandfather   . Heart disease Maternal Grandfather   . Irritable bowel syndrome Mother   . Cancer Neg Hx     History   Social History  . Marital Status: Married    Spouse Name: N/A    Number of Children: 1  . Years of Education: N/A   Occupational History  . Professor     Nurse, learning disability   Social History Main Topics  . Smoking status: Former Smoker -- 2.00 packs/day for 43 years    Types: Cigarettes    Quit date: 08/13/2008  . Smokeless tobacco: Never Used  . Alcohol Use: No  . Drug Use: No  . Sexual Activity: None   Other Topics Concern  . 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  AMBULATORY NON FORMULARY MEDICATION, CPAP MACHINE, Disp: , Rfl:  .  cetirizine (ZYRTEC) 10 MG tablet, Take 10 mg by mouth daily., Disp: , Rfl:  .  dicyclomine (BENTYL) 10 MG capsule, TAKE 1 CAPSULE BY MOUTH 3 TIMES DAILY AS NEEDED, Disp: 90 capsule, Rfl: 0 .  fluticasone (FLONASE) 50 MCG/ACT nasal spray, Place 2 sprays into both nostrils daily., Disp: 16 g, Rfl: 6 .  ibuprofen (ADVIL,MOTRIN) 200 MG tablet, Take 200 mg by mouth every 6 (six) hours as needed., Disp: , Rfl:  .  Naproxen (NAPROSYN PO), Take by mouth daily., Disp: , Rfl:  .  NON FORMULARY, Apricot seed, Disp: , Rfl:  .  NON FORMULARY, Indole-3-carbinole, Disp: , Rfl:  .  NON FORMULARY, Leitril, Disp: , Rfl:   EXAM:  Filed Vitals:   08/27/14 1325  BP: 130/84  Pulse: 74  Temp: 97.8 F (36.6 C)    Body  mass index is 31.56 kg/(m^2).  GENERAL: vitals reviewed and listed above, alert, oriented, appears well hydrated and in no acute distress  HEENT: atraumatic, conjunttiva clear, no obvious abnormalities on inspection of external nose and ears  NECK: no obvious masses on inspection  LUNGS: clear to auscultation bilaterally, no wheezes, rales or rhonchi, good air movement  CV: HRRR, no peripheral edema  MS: moves all extremities without noticeable abnormality Normal inspection of hands TTP specifically around L 2nd MCP joint and with movement of this jt Normal strength and sensation and cap refill in hands bilat Neg tinel, neg phalen Accupunture helped and he wants to try to continue this He wants to see orthopedic doctor as well  PSYCH: pleasant and cooperative, no obvious depression or anxiety  ASSESSMENT AND PLAN:  Discussed the following assessment and plan:  Hand pain, left - Plan: Ambulatory referral to Orthopedic Surgery  -Patient  advised to return or notify a doctor immediately if symptoms worsen or persist or new concerns arise.  Patient Instructions  Rico Junker [phone] Address: [address], [place], [place] [address]  -We placed a referral for you as discussed the orthopedic doctor per your request. It usually takes about 1-2 weeks to process and schedule this referral. If you have not heard from [place] regarding this appointment in 2 weeks please contact our office.   Tylenol 500-1000mg  up to 3 times per day     Colin Benton R.

## 2014-08-27 NOTE — Progress Notes (Signed)
Pre visit review using our clinic review tool, if applicable. No additional management support is needed unless otherwise documented below in the visit note. 

## 2014-08-27 NOTE — Progress Notes (Signed)
HPI:  Acute visit for:  Arthritis: -hands -reports: -denies:  ROS: See pertinent positives and negatives per HPI.  Past Medical History  Diagnosis Date  . Chronic diarrhea   . Dyslipidemia   . Osteoarthritis   . History of tobacco use   . Sleep apnea     CPAP Machine   . Prostate cancer 11/16/2012    T1c Gleason 6 prostate cancer  . Hypertrophy of prostate without urinary obstruction and other lower urinary tract symptoms (LUTS)   . Elevated prostate specific antigen (PSA)   . Unspecified intestinal obstruction   . Hearing difficulty of both ears     wears hearing aids    Past Surgical History  Procedure Laterality Date  . Appendectomy  1981    Also had intestinal tumor removed  . Colon resection  1980    fibroid  . Ankle surgery    . Prostate biopsy  11/16/2012  . Knee arthroscopy w/ meniscal repair  2012    left knee    Family History  Problem Relation Age of Onset  . Arthritis    . Colon cancer Maternal Grandfather   . Heart disease Maternal Grandfather   . Irritable bowel syndrome Mother   . Cancer Neg Hx     History   Social History  . Marital Status: Married    Spouse Name: N/A    Number of Children: 1  . Years of Education: N/A   Occupational History  . Professor     Nurse, learning disability   Social History Main Topics  . Smoking status: Former Smoker -- 2.00 packs/day for 43 years    Types: Cigarettes    Quit date: 08/13/2008  . Smokeless tobacco: Never Used  . Alcohol Use: No  . Drug Use: No  . Sexual Activity: Not on file   Other Topics Concern  . Not on file   Social History Narrative   Daily caffeine       Work or School: business, teaches, used to [employer] Situation: lives with wife      Spiritual Beliefs: none      Lifestyle: regular exercise and healthy diet              Current outpatient prescriptions:  .  AMBULATORY NON FORMULARY MEDICATION, CPAP MACHINE, Disp: , Rfl:  .  cetirizine (ZYRTEC) 10 MG tablet,  Take 10 mg by mouth daily., Disp: , Rfl:  .  dicyclomine (BENTYL) 10 MG capsule, TAKE 1 CAPSULE BY MOUTH 3 TIMES DAILY AS NEEDED, Disp: 90 capsule, Rfl: 0 .  fluticasone (FLONASE) 50 MCG/ACT nasal spray, Place 2 sprays into both nostrils daily., Disp: 16 g, Rfl: 6 .  ibuprofen (ADVIL,MOTRIN) 200 MG tablet, Take 200 mg by mouth every 6 (six) hours as needed., Disp: , Rfl:  .  metoCLOPramide (REGLAN) 10 MG tablet, Take 1 tablet (10 mg total) by mouth every 8 (eight) hours as needed for nausea., Disp: 8 tablet, Rfl: 0 .  milk thistle 175 MG tablet, Take 175 mg by mouth daily., Disp: , Rfl:  .  NON FORMULARY, Apricot seed, Disp: , Rfl:  .  NON FORMULARY, Indole-3-carbinole, Disp: , Rfl:   EXAM:  There were no vitals filed for this visit.  There is no weight on file to calculate BMI.  GENERAL: vitals reviewed and listed above, alert, oriented, appears well hydrated and in no acute distress  HEENT: atraumatic, conjunttiva clear, no obvious abnormalities on inspection  of external nose and ears  NECK: no obvious masses on inspection  LUNGS: clear to auscultation bilaterally, no wheezes, rales or rhonchi, good air movement  CV: HRRR, no peripheral edema  MS: moves all extremities without noticeable abnormality  PSYCH: pleasant and cooperative, no obvious depression or anxiety  ASSESSMENT AND PLAN:  Discussed the following assessment and plan:  No diagnosis found.  -Patient advised to return or notify a doctor immediately if symptoms worsen or persist or new concerns arise.  There are no Patient Instructions on file for this visit.   Colin Benton R.   This encounter was created in error - please disregard.

## 2014-08-27 NOTE — Patient Instructions (Signed)
Brett Tate [id number] Address: [address], [place], [place] [address]  -We placed a referral for you as discussed the orthopedic doctor per your request. It usually takes about 1-2 weeks to process and schedule this referral. If you have not heard from [place] regarding this appointment in 2 weeks please contact our office.   Tylenol 500-1000mg  up to 3 times per day

## 2015-02-18 DIAGNOSIS — C61 Malignant neoplasm of prostate: Secondary | ICD-10-CM | POA: Diagnosis not present

## 2015-02-21 ENCOUNTER — Encounter: Payer: Self-pay | Admitting: Adult Health

## 2015-02-21 ENCOUNTER — Ambulatory Visit (INDEPENDENT_AMBULATORY_CARE_PROVIDER_SITE_OTHER): Payer: Medicare Other | Admitting: Adult Health

## 2015-02-21 VITALS — BP 170/104 | HR 75 | Temp 97.4°F | Resp 20 | Ht 71.0 in | Wt 222.9 lb

## 2015-02-21 DIAGNOSIS — J3489 Other specified disorders of nose and nasal sinuses: Secondary | ICD-10-CM | POA: Diagnosis not present

## 2015-02-21 DIAGNOSIS — R030 Elevated blood-pressure reading, without diagnosis of hypertension: Secondary | ICD-10-CM | POA: Diagnosis not present

## 2015-02-21 MED ORDER — LEVOCETIRIZINE DIHYDROCHLORIDE 5 MG PO TABS: 5.0000 mg | ORAL_TABLET | Freq: Every evening | ORAL | Status: DC

## 2015-02-21 NOTE — Progress Notes (Signed)
Subjective:    Patient ID: Brett Tate., male    DOB: 12-26-1945, 69 y.o.   MRN: [id number]  HPI  69 year old male who presents to the office today for clear rhinorrhea from left nostril. since last night. He woke up " choking". Has tried Flonase and Zyrtec with minimal relief.   He also endorses having pressure behind his eyes for the last week.+ "slight headache". No vision changes, no feeling of dizziness or lightheadedness.   Denies n/v/d/fevers.    On exam it was found that patient had a blood pressure of 170/104. It was taken  10 minutes later and it was still 170/100. He denies taking any decongestant. Has never had a history of high blood pressure, he exercises and eats a heart healthy diet which is low in sodium.  Denies any chest pain.    Review of Systems  Constitutional: Negative.   HENT: Positive for congestion, postnasal drip and rhinorrhea. Negative for ear discharge, ear pain (left ear fullness), sinus pressure and sore throat.   Eyes: Negative.   Respiratory: Negative.   Cardiovascular: Negative.   Gastrointestinal: Negative.   Musculoskeletal: Negative.   Neurological: Negative.   All other systems reviewed and are negative.  Past Medical History  Diagnosis Date  . Chronic diarrhea   . Dyslipidemia   . Osteoarthritis   . History of tobacco use   . Sleep apnea     CPAP Machine   . Prostate cancer 11/16/2012    T1c Gleason 6 prostate cancer  . Hypertrophy of prostate without urinary obstruction and other lower urinary tract symptoms (LUTS)   . Elevated prostate specific antigen (PSA)   . Unspecified intestinal obstruction   . Hearing difficulty of both ears     wears hearing aids    History   Social History  . Marital Status: Married    Spouse Name: N/A  . Number of Children: 1  . Years of Education: N/A   Occupational History  . Professor     Nurse, learning disability   Social History Main Topics  . Smoking status: Former Smoker -- 2.00  packs/day for 43 years    Types: Cigarettes    Quit date: 08/13/2008  . Smokeless tobacco: Never Used  . Alcohol Use: No  . Drug Use: No  . Sexual Activity: Not on file   Other Topics Concern  . Not on file   Social History Narrative   Daily caffeine       Work or School: business, teaches, used to [employer] Situation: lives with wife      Spiritual Beliefs: none      Lifestyle: regular exercise and healthy diet             Past Surgical History  Procedure Laterality Date  . Appendectomy  1981    Also had intestinal tumor removed  . Colon resection  1980    fibroid  . Ankle surgery    . Prostate biopsy  11/16/2012  . Knee arthroscopy w/ meniscal repair  2012    left knee    Family History  Problem Relation Age of Onset  . Arthritis    . Colon cancer Maternal Grandfather   . Heart disease Maternal Grandfather   . Irritable bowel syndrome Mother   . Cancer Neg Hx     No Known Allergies  Current Outpatient Prescriptions on File Prior to Visit  Medication Sig Dispense Refill  .  AMBULATORY NON FORMULARY MEDICATION CPAP MACHINE    . cetirizine (ZYRTEC) 10 MG tablet Take 10 mg by mouth daily.    Marland Kitchen dicyclomine (BENTYL) 10 MG capsule TAKE 1 CAPSULE BY MOUTH 3 TIMES DAILY AS NEEDED 90 capsule 0  . fluticasone (FLONASE) 50 MCG/ACT nasal spray Place 2 sprays into both nostrils daily. 16 g 6  . ibuprofen (ADVIL,MOTRIN) 200 MG tablet Take 200 mg by mouth every 6 (six) hours as needed.    . Naproxen (NAPROSYN PO) Take by mouth daily.    . NON FORMULARY Apricot seed    . NON FORMULARY Indole-3-carbinole    . NON FORMULARY Leitril     No current facility-administered medications on file prior to visit.    BP 170/104 mmHg  Pulse 75  Temp(Src) 97.4 F (36.3 C) (Oral)  Resp 20  Ht 5\' 11"  (1.803 m)  Wt 222 lb 14.4 oz (101.107 kg)  BMI 31.10 kg/m2  SpO2 97%        Objective:   Physical Exam  Constitutional: He is oriented to person, place, and time. He  appears well-developed and well-nourished. No distress.  HENT:  Head: Normocephalic and atraumatic.  Right Ear: External ear normal.  Left Ear: External ear normal.  Nose: Nose normal.  Mouth/Throat: Oropharynx is clear and moist.  Clear discharge from left nare  Eyes: Conjunctivae and EOM are normal. Pupils are equal, round, and reactive to light. Right eye exhibits no discharge. Left eye exhibits no discharge.  Cardiovascular: Normal rate, regular rhythm, normal heart sounds and intact distal pulses.  Exam reveals no gallop and no friction rub.   No murmur heard. Pulmonary/Chest: Effort normal and breath sounds normal. No respiratory distress. He has no wheezes. He has no rales. He exhibits no tenderness.  Musculoskeletal: Normal range of motion. He exhibits no edema or tenderness.  Lymphadenopathy:    He has no cervical adenopathy.  Neurological: He is alert and oriented to person, place, and time.  Skin: Skin is warm and dry. No rash noted. He is not diaphoretic. No erythema. No pallor.  Psychiatric: He has a normal mood and affect. His behavior is normal. Judgment and thought content normal.  Nursing note and vitals reviewed.      Assessment & Plan:  1. Elevated blood pressure (not hypertension) - 150/98 upon discharge.  - CMP - EKG 12-Lead - TSH - Monitor blood pressure at home and record them in a log.  - Follow up with me on 4 days, bring log.  - Go to the ER with BP 086-578'I systolic or with sudden onset headache or vision changes.  2. Rhinorrhea - levocetirizine (XYZAL) 5 MG tablet; Take 1 tablet (5 mg total) by mouth every evening.  Dispense: 30 tablet; Refill: 0

## 2015-02-21 NOTE — Progress Notes (Signed)
Pre visit review using our clinic review tool, if applicable. No additional management support is needed unless otherwise documented below in the visit note. 

## 2015-02-21 NOTE — Patient Instructions (Signed)
I will follow up with you regarding your lab work. Buy a blood pressure machine while you are at the pharmacy and monitor your blood pressures. Follow up with me on Friday.  If you notice that your blood pressure is climbing towards the 190's-200's then go to the ER.

## 2015-02-22 DIAGNOSIS — C61 Malignant neoplasm of prostate: Secondary | ICD-10-CM | POA: Diagnosis not present

## 2015-02-22 DIAGNOSIS — R972 Elevated prostate specific antigen [PSA]: Secondary | ICD-10-CM | POA: Diagnosis not present

## 2015-03-24 ENCOUNTER — Encounter: Payer: Self-pay | Admitting: Family Medicine

## 2015-03-24 ENCOUNTER — Ambulatory Visit (INDEPENDENT_AMBULATORY_CARE_PROVIDER_SITE_OTHER): Payer: Medicare Other | Admitting: Family Medicine

## 2015-03-24 VITALS — BP 152/100 | HR 91 | Temp 98.1°F | Ht 71.0 in | Wt 221.3 lb

## 2015-03-24 DIAGNOSIS — M6588 Other synovitis and tenosynovitis, other site: Secondary | ICD-10-CM | POA: Diagnosis not present

## 2015-03-24 DIAGNOSIS — M659 Synovitis and tenosynovitis, unspecified: Secondary | ICD-10-CM

## 2015-03-24 NOTE — Progress Notes (Signed)
HPI:  Acute visit for:  Hand Pain: -reports has trigger finger -third digit, proximal -sig pain the last 1 day -finger does catch sometimes -denies: weakness, numbness, injury   ROS: See pertinent positives and negatives per HPI.  Past Medical History  Diagnosis Date  . Chronic diarrhea   . Dyslipidemia   . Osteoarthritis   . History of tobacco use   . Sleep apnea     CPAP Machine   . Prostate cancer 11/16/2012    T1c Gleason 6 prostate cancer  . Hypertrophy of prostate without urinary obstruction and other lower urinary tract symptoms (LUTS)   . Elevated prostate specific antigen (PSA)   . Unspecified intestinal obstruction   . Hearing difficulty of both ears     wears hearing aids    Past Surgical History  Procedure Laterality Date  . Appendectomy  1981    Also had intestinal tumor removed  . Colon resection  1980    fibroid  . Ankle surgery    . Prostate biopsy  11/16/2012  . Knee arthroscopy w/ meniscal repair  2012    left knee    Family History  Problem Relation Age of Onset  . Arthritis    . Colon cancer Maternal Grandfather   . Heart disease Maternal Grandfather   . Irritable bowel syndrome Mother   . Cancer Neg Hx     Social History   Social History  . Marital Status: Married    Spouse Name: N/A  . Number of Children: 1  . Years of Education: N/A   Occupational History  . Professor     Nurse, learning disability   Social History Main Topics  . Smoking status: Former Smoker -- 2.00 packs/day for 43 years    Types: Cigarettes    Quit date: 08/13/2008  . Smokeless tobacco: Never Used  . Alcohol Use: No  . Drug Use: No  . Sexual Activity: Not Asked   Other Topics Concern  . 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  AMBULATORY NON FORMULARY MEDICATION, CPAP MACHINE, Disp: , Rfl:  .  cetirizine (ZYRTEC) 10 MG tablet, Take 10 mg by mouth daily., Disp: , Rfl:  .  dicyclomine (BENTYL) 10 MG capsule, TAKE 1 CAPSULE BY MOUTH 3 TIMES DAILY AS NEEDED, Disp: 90 capsule, Rfl: 0 .  fluticasone (FLONASE) 50 MCG/ACT nasal spray, Place 2 sprays into both nostrils daily., Disp: 16 g, Rfl: 6 .  ibuprofen (ADVIL,MOTRIN) 200 MG tablet, Take 200 mg by mouth every 6 (six) hours as needed., Disp: , Rfl:  .  levocetirizine (XYZAL) 5 MG tablet, Take 1 tablet (5 mg total) by mouth every evening., Disp: 30 tablet, Rfl: 0 .  Naproxen (NAPROSYN PO), Take by mouth daily., Disp: , Rfl:  .  NON FORMULARY, Apricot seed, Disp: , Rfl:  .  NON FORMULARY, Indole-3-carbinole, Disp: , Rfl:  .  NON FORMULARY, Leitril, Disp: , Rfl:   EXAM:  Filed Vitals:   03/24/15 1403  BP: 152/100  Pulse: 91  Temp: 98.1 F (36.7 C)    Body mass index is 30.88 kg/(m^2).  GENERAL:  vitals reviewed and listed above, alert, oriented, appears well hydrated and in no acute distress  HEENT: atraumatic, conjunttiva clear, no obvious abnormalities on inspection of external nose and ears  NECK: no obvious masses on inspection  LUNGS: clear to auscultation bilaterally, no wheezes, rales or rhonchi, good air movement  CV: HRRR, no peripheral edema  MS: moves all extremities without noticeable abnormality -TTP and nodule R middle finger flexor sheath just proximal to MCP jt -no swelling, redness, weakness, catching on exam -cap refill normal  PSYCH: pleasant and cooperative, no obvious depression or anxiety  ASSESSMENT AND PLAN:  Discussed the following assessment and plan:  Tenosynovitis of finger  -he prefers to avoid medicines, shots, surgery as much as possible -trial ice, splint (applied), stretching, short course nsaids -he may be interested in seeing sports medicine if not improving for inj -Patient advised to return or  notify a doctor immediately if symptoms worsen or persist or new concerns arise.  Patient Instructions  Ice a few times per day if needed  Ibuprofen or aleve per instructions as needed for 3 days  Can try splint per instructions  Stretching per instructions     Colin Benton R.

## 2015-03-24 NOTE — Patient Instructions (Addendum)
Ice a few times per day if needed  Ibuprofen or aleve per instructions as needed for 3 days  Can try splint per instructions  Stretching per instructions  Call if not better in 1 week, will refer to sport medicine for injection if you wish

## 2015-03-24 NOTE — Progress Notes (Signed)
Pre visit review using our clinic review tool, if applicable. No additional management support is needed unless otherwise documented below in the visit note. 

## 2015-06-09 ENCOUNTER — Other Ambulatory Visit: Payer: Self-pay | Admitting: Family Medicine

## 2015-06-27 DIAGNOSIS — R1013 Epigastric pain: Secondary | ICD-10-CM | POA: Diagnosis not present

## 2015-06-27 DIAGNOSIS — R109 Unspecified abdominal pain: Secondary | ICD-10-CM | POA: Diagnosis not present

## 2015-06-27 DIAGNOSIS — I10 Essential (primary) hypertension: Secondary | ICD-10-CM | POA: Diagnosis not present

## 2015-06-27 DIAGNOSIS — E860 Dehydration: Secondary | ICD-10-CM | POA: Diagnosis not present

## 2015-06-27 DIAGNOSIS — K5660 Unspecified intestinal obstruction: Secondary | ICD-10-CM | POA: Diagnosis not present

## 2015-06-27 DIAGNOSIS — R1084 Generalized abdominal pain: Secondary | ICD-10-CM | POA: Diagnosis not present

## 2015-06-27 DIAGNOSIS — K5669 Other intestinal obstruction: Secondary | ICD-10-CM | POA: Diagnosis not present

## 2015-06-28 DIAGNOSIS — E860 Dehydration: Secondary | ICD-10-CM | POA: Diagnosis not present

## 2015-06-28 DIAGNOSIS — R110 Nausea: Secondary | ICD-10-CM | POA: Diagnosis not present

## 2015-06-28 DIAGNOSIS — A419 Sepsis, unspecified organism: Secondary | ICD-10-CM | POA: Diagnosis not present

## 2015-06-28 DIAGNOSIS — R109 Unspecified abdominal pain: Secondary | ICD-10-CM | POA: Diagnosis not present

## 2015-06-28 DIAGNOSIS — K5660 Unspecified intestinal obstruction: Secondary | ICD-10-CM | POA: Diagnosis not present

## 2015-06-28 DIAGNOSIS — K5669 Other intestinal obstruction: Secondary | ICD-10-CM | POA: Diagnosis not present

## 2015-06-29 DIAGNOSIS — R109 Unspecified abdominal pain: Secondary | ICD-10-CM | POA: Diagnosis not present

## 2015-06-29 DIAGNOSIS — K5669 Other intestinal obstruction: Secondary | ICD-10-CM | POA: Diagnosis not present

## 2015-06-29 DIAGNOSIS — E860 Dehydration: Secondary | ICD-10-CM | POA: Diagnosis not present

## 2015-11-11 ENCOUNTER — Encounter: Payer: Self-pay | Admitting: Family Medicine

## 2015-11-11 ENCOUNTER — Ambulatory Visit (INDEPENDENT_AMBULATORY_CARE_PROVIDER_SITE_OTHER): Payer: Medicare HMO | Admitting: Family Medicine

## 2015-11-11 ENCOUNTER — Telehealth: Payer: Self-pay | Admitting: Family Medicine

## 2015-11-11 VITALS — BP 170/102 | HR 93 | Temp 98.0°F | Wt 221.6 lb

## 2015-11-11 DIAGNOSIS — J309 Allergic rhinitis, unspecified: Secondary | ICD-10-CM

## 2015-11-11 DIAGNOSIS — K529 Noninfective gastroenteritis and colitis, unspecified: Secondary | ICD-10-CM

## 2015-11-11 DIAGNOSIS — R062 Wheezing: Secondary | ICD-10-CM | POA: Diagnosis not present

## 2015-11-11 DIAGNOSIS — I10 Essential (primary) hypertension: Secondary | ICD-10-CM

## 2015-11-11 MED ORDER — LISINOPRIL-HYDROCHLOROTHIAZIDE 10-12.5 MG PO TABS: 1.0000 | ORAL_TABLET | Freq: Every day | ORAL | Status: DC

## 2015-11-11 MED ORDER — PREDNISONE 20 MG PO TABS: 20.0000 mg | ORAL_TABLET | Freq: Every day | ORAL | Status: AC

## 2015-11-11 MED ORDER — IPRATROPIUM-ALBUTEROL 0.5-2.5 (3) MG/3ML IN SOLN
3.0000 mL | Freq: Once | RESPIRATORY_TRACT | Status: AC
  Administered 2015-11-11: 3 mL via RESPIRATORY_TRACT

## 2015-11-11 MED ORDER — ALBUTEROL SULFATE HFA 108 (90 BASE) MCG/ACT IN AERS: 2.0000 | INHALATION_SPRAY | Freq: Four times a day (QID) | RESPIRATORY_TRACT | Status: DC

## 2015-11-11 MED ORDER — DICYCLOMINE HCL 10 MG PO CAPS: 10.0000 mg | ORAL_CAPSULE | Freq: Three times a day (TID) | ORAL | Status: DC

## 2015-11-11 NOTE — Telephone Encounter (Signed)
Pt walked into the office today.  Complains of runny nose, post nasal drip, headache and fatigue X 3 days.  Appt made to see Dr. Martinique at 4 PM today.  Cory Nafziger assessed the pt before he was released to come back.  Advised to cut out decongestants until he sees Dr. Martinique due to high bp.  Also advised to cut out OTC energy boosters as those could be increasing hir bp as well.  Vitals listed below.  BP: 178/104 Temp: 98.3 O2: 98% HR: 71

## 2015-11-11 NOTE — Patient Instructions (Addendum)
DASH diet recommended: high in vegetables, fruits, low-fat dairy products, whole grains, poultry, fish, and nuts; and low in sweets, sugar-sweetened beverages, and red meats.  Prednisone can irritate stomach, so please take med with food. Because elevated blood pressure, I recommend a small dose. Steam inhalations, Allegra 180 mg daily. NO decongestants. Please follow in 3 weeks. Monitor BP at home. Lab in 7-10 days.

## 2015-11-11 NOTE — Progress Notes (Signed)
Subjective:    Patient ID: Brett Tate., male    DOB: Jul 15, 1946, 70 y.o.   MRN: [id number]  HPI   MrDANIEL E Vuk Tate. is a 70 y.o.  male, who here today complaining about upper respiratory symptoms for about 5 days. She reports similar symptoms in prior years around the same time, he is not sure about allergies. He has been on Flonase nasal spray before. Reporting nasal congestion, clear rhinorrhea, sneezing, productive cough with clear sputum,and mild wheezing.No Hx of asthma. + Former smoker. He has not noted fever, chills, myalgias, chest pain, dyspnea, or palpitations.  -He is also requesting a refills on Bentyl, which she takes as needed for chronic diarrhea and abdominal cramps, about every 6-8 months, status post colectomy.  -His blood pressure is elevated today, he denies any history of hypertension, reports elevated blood pressure during prior visits. He follows a healthy diet, low-salt.Home BP's recently "up and down" "spiking" lately.    Current Outpatient Prescriptions on File Prior to Visit  Medication Sig Dispense Refill  . AMBULATORY NON FORMULARY MEDICATION CPAP MACHINE    . fluticasone (FLONASE) 50 MCG/ACT nasal spray Place 2 sprays into both nostrils daily. 16 g 6  . ibuprofen (ADVIL,MOTRIN) 200 MG tablet Take 200 mg by mouth every 6 (six) hours as needed.    . Naproxen (NAPROSYN PO) Take by mouth daily.    . NON FORMULARY Apricot seed    . NON FORMULARY Indole-3-carbinole    . NON FORMULARY Leitril     No current facility-administered medications on file prior to visit.   Past Medical History  Diagnosis Date  . Chronic diarrhea   . Dyslipidemia   . Osteoarthritis   . History of tobacco use   . Sleep apnea     CPAP Machine   . Prostate cancer ([place]) 11/16/2012    T1c Gleason 6 prostate cancer  . Hypertrophy of prostate without urinary obstruction and other lower urinary tract symptoms (LUTS)   . Elevated prostate specific antigen (PSA)   .  Unspecified intestinal obstruction ([place])   . Hearing difficulty of both ears     wears hearing aids   Social History   Social History  . Marital Status: Married    Spouse Name: N/A  . Number of Children: 1  . Years of Education: N/A   Occupational History  . Professor     Nurse, learning disability   Social History Main Topics  . Smoking status: Former Smoker -- 2.00 packs/day for 43 years    Types: Cigarettes    Quit date: 08/13/2008  . Smokeless tobacco: Never Used  . Alcohol Use: No  . Drug Use: No  . Sexual Activity: Not Asked   Other Topics Concern  . 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  HENT: Positive for congestion, postnasal drip, rhinorrhea, sinus pressure and sneezing. Negative for nosebleeds, sore throat, trouble swallowing and voice change.   Eyes: Positive for discharge. Negative for redness, itching and visual disturbance.  Respiratory: Positive for cough (productive  with clear/yellowish sputum) and wheezing. Negative for shortness of breath.        Hx of OSA and former smoker  Cardiovascular: Negative for chest pain, palpitations and leg swelling.  Gastrointestinal: Negative for nausea, vomiting, abdominal pain and blood in stool.       Negative for changes in bowel habits  Genitourinary: Negative for dysuria, hematuria and decreased urine volume.  Musculoskeletal: Negative for myalgias and back pain.  Skin: Negative for rash.  Neurological: Negative for dizziness, seizures, weakness, numbness and headaches.  Psychiatric/Behavioral: Negative.     Filed Vitals:   11/11/15 1628  BP: 170/102  Pulse: 93  Temp: 98 F (36.7 C)    SpO2 Readings from Last 3 Encounters:  11/11/15 94%  02/21/15 97%  06/22/14 100%       Objective:   Physical Exam  Constitutional: He is oriented to person, place, and time. He appears well-developed and well-nourished. No distress.  HENT:  Head: Normocephalic and atraumatic.  Right Ear: Hearing and ear canal normal. Tympanic membrane is not erythematous. A middle ear effusion is present.  Left Ear: Hearing and ear canal normal. Tympanic membrane is not erythematous. A middle ear effusion is present.  Nose: Rhinorrhea present. No mucosal edema or sinus tenderness. Right sinus exhibits no maxillary sinus tenderness and no frontal sinus tenderness. Left sinus exhibits no maxillary sinus tenderness and no frontal sinus tenderness.  Mouth/Throat: Oropharynx is clear and moist and mucous membranes are normal. No oral lesions. No oropharyngeal exudate, posterior oropharyngeal edema or posterior oropharyngeal erythema.  Hypertrophic turbinates  Eyes: Conjunctivae and EOM are normal. Pupils are equal, round, and reactive to light.  Neck: Neck supple. No thyromegaly present.  Cardiovascular: Normal rate and regular rhythm.   No murmur heard. Pulmonary/Chest: Effort normal. He has wheezes (mild, diffuse). He has no rales.  Abdominal: Soft. He exhibits no distension and no mass. There is no tenderness.  Musculoskeletal: He exhibits no edema.  Lymphadenopathy:    He has no cervical adenopathy.  Neurological: He is alert and oriented to person, place, and time. He has normal strength. No cranial nerve deficit. Coordination and gait normal.  Skin: Skin is warm.  Psychiatric: He has a normal mood and affect.       Assessment & Plan:  Brett Tate was seen today for uri.  Diagnoses and all orders for this visit:  Essential hypertension, benign Re-checked 160/100, elevated BP's at home and during prior OV's, so pharmacologic treatment recommended today. Some side effects from meds  discussed. Continue low salt diet. BMP in 7-10 days. F/U in 3 weeks, before if needed.  -     lisinopril-hydrochlorothiazide (PRINZIDE,ZESTORETIC) 10-12.5 MG tablet; Take 1 tablet by mouth daily. -     Basic Metabolic Panel; Future  Wheezing without diagnosis of asthma O2 sat after Duoneb treatment 97% @ RA. Lung auscultation: no rales or rhonchi, minimal wheezing. Prednisone side effects discussed, low dose recommended given his elevated BP. Instructed clearly about warning signs.  -     ipratropium-albuterol (DUONEB) 0.5-2.5 (3) MG/3ML nebulizer solution 3 mL; Take 3 mLs by nebulization once. -     predniSONE (DELTASONE) 20 MG tablet; Take 1 tablet (20 mg total) by mouth daily with breakfast. With breakfast. -     albuterol (PROVENTIL HFA;VENTOLIN HFA) 108 (90 Base) MCG/ACT inhaler 2 puff; Inhale 2 puffs into the lungs every 6 (six) hours.  Allergic rhinitis, unspecified allergic rhinitis type UR symptoms suggest allergic etiology. OTC Rhinocort nasal spray recommended. Avoid decongestants.  Allegra 180 mg daily may help.  Chronic diarrhea Intermittent and stable. No changes in current management.   -     dicyclomine (BENTYL) 10 MG capsule; Take 1 capsule (10 mg total) by mouth 4 (four) times daily -  before meals and at bedtime.    Eryck Negron G. Martinique, MD  [facility]. [place] office.

## 2015-11-11 NOTE — Progress Notes (Signed)
Pre visit review using our clinic review tool, if applicable. No additional management support is needed unless otherwise documented below in the visit note. 

## 2015-11-23 ENCOUNTER — Telehealth: Payer: Self-pay | Admitting: General Practice

## 2015-11-23 NOTE — Telephone Encounter (Signed)
PA for dicyclomine began in CMM (Key: [id number])

## 2015-11-26 ENCOUNTER — Other Ambulatory Visit: Payer: Self-pay | Admitting: Family Medicine

## 2016-02-07 ENCOUNTER — Other Ambulatory Visit: Payer: Self-pay | Admitting: Family Medicine

## 2016-02-16 ENCOUNTER — Telehealth: Payer: Self-pay | Admitting: *Deleted

## 2016-02-16 DIAGNOSIS — J3089 Other allergic rhinitis: Secondary | ICD-10-CM

## 2016-02-16 MED ORDER — FLUTICASONE PROPIONATE 50 MCG/ACT NA SUSP: 2.0000 | Freq: Every day | NASAL | Status: DC

## 2016-02-16 NOTE — Telephone Encounter (Signed)
Rx done. 

## 2016-02-16 NOTE — Telephone Encounter (Signed)
[facility] on [address] sent request for Fluticasone Prop 50 MCG Spray, 16.0 GM Sixteen. Use 2 sprays in each nostril daily. Last filled on 08-27-13. Patient last seen by Dr. Martinique on 11-11-15 for URI. Dr. Martinique recommended OTC nasal spray.

## 2016-02-18 ENCOUNTER — Other Ambulatory Visit: Payer: Self-pay | Admitting: Family Medicine

## 2016-03-19 ENCOUNTER — Other Ambulatory Visit: Payer: Self-pay | Admitting: Family Medicine

## 2016-05-12 ENCOUNTER — Other Ambulatory Visit: Payer: Self-pay | Admitting: Family Medicine

## 2016-05-15 NOTE — Telephone Encounter (Signed)
Please call patient. On roc, looks like really needs follow up to check on his blood pressure, etc. No refills until appt. Thanks.

## 2016-05-16 NOTE — Telephone Encounter (Signed)
Left message for patient to return phone call.  

## 2016-06-11 NOTE — Telephone Encounter (Signed)
Please call pt.  Needs appt

## 2016-06-12 NOTE — Telephone Encounter (Signed)
I called the pt and informed him of the message below and offered to schedule an appt and he stated it is not that critical that he have the Bentyl as he likes to have this on hand.  Patient stated he will call back for an appt.

## 2016-06-27 NOTE — Progress Notes (Deleted)
HPI:  PMH sig for HTN (poor compliance with follow up), IBS on bentyl, dyslipidemia here for follow up. Due for pneumonia vaccine.  ROS: See pertinent positives and negatives per HPI.  Past Medical History:  Diagnosis Date  . Chronic diarrhea   . Dyslipidemia   . Elevated prostate specific antigen (PSA)   . Hearing difficulty of both ears    wears hearing aids  . History of tobacco use   . Hypertrophy of prostate without urinary obstruction and other lower urinary tract symptoms (LUTS)   . Osteoarthritis   . Prostate cancer ([place]) 11/16/2012   T1c Gleason 6 prostate cancer  . Sleep apnea    CPAP Machine   . Unspecified intestinal obstruction [facility])     Past Surgical History:  Procedure Laterality Date  . ANKLE SURGERY    . APPENDECTOMY  1981   Also had intestinal tumor removed  . COLON RESECTION  1980   fibroid  . KNEE ARTHROSCOPY W/ MENISCAL REPAIR  2012   left knee  . PROSTATE BIOPSY  11/16/2012    Family History  Problem Relation Age of Onset  . Arthritis    . Colon cancer Maternal Grandfather   . Heart disease Maternal Grandfather   . Irritable bowel syndrome Mother   . Cancer Neg Hx     Social History   Social History  . Marital status: Married    Spouse name: N/A  . Number of children: 1  . Years of education: N/A   Occupational History  . Professor Acupuncturist   Social History Main Topics  . Smoking status: Former Smoker    Packs/day: 2.00    Years: 43.00    Types: Cigarettes    Quit date: 08/13/2008  . Smokeless tobacco: Never Used  . Alcohol use No  . Drug use: No  . Sexual activity: Not on file   Other Topics Concern  . Not on file   Social History Narrative   Daily caffeine       Work or School: business, teaches, used to [employer] Situation: lives with wife      Spiritual Beliefs: none      Lifestyle: regular exercise and healthy diet              Current Outpatient Prescriptions:  .  AMBULATORY NON  FORMULARY MEDICATION, CPAP MACHINE, Disp: , Rfl:  .  dicyclomine (BENTYL) 10 MG capsule, TAKE 1 CAPSULE BY MOUTH 3 TIMES DAILY AS NEEDED, Disp: 90 capsule, Rfl: 0 .  fluticasone (FLONASE) 50 MCG/ACT nasal spray, Place 2 sprays into both nostrils daily., Disp: 16 g, Rfl: 1 .  ibuprofen (ADVIL,MOTRIN) 200 MG tablet, Take 200 mg by mouth every 6 (six) hours as needed., Disp: , Rfl:  .  lisinopril-hydrochlorothiazide (PRINZIDE,ZESTORETIC) 10-12.5 MG tablet, Take 1 tablet by mouth daily., Disp: 90 tablet, Rfl: 3 .  Naproxen (NAPROSYN PO), Take by mouth daily., Disp: , Rfl:  .  NON FORMULARY, Apricot seed, Disp: , Rfl:  .  NON FORMULARY, Indole-3-carbinole, Disp: , Rfl:  .  NON FORMULARY, Leitril, Disp: , Rfl:   EXAM:  There were no vitals filed for this visit.  There is no height or weight on file to calculate BMI.  GENERAL: vitals reviewed and listed above, alert, oriented, appears well hydrated and in no acute distress  HEENT: atraumatic, conjunttiva clear, no obvious abnormalities on inspection of external nose and ears  NECK:  no obvious masses on inspection  LUNGS: clear to auscultation bilaterally, no wheezes, rales or rhonchi, good air movement  CV: HRRR, no peripheral edema  MS: moves all extremities without noticeable abnormality  PSYCH: pleasant and cooperative, no obvious depression or anxiety  ASSESSMENT AND PLAN:  Discussed the following assessment and plan:  Irritable bowel syndrome, unspecified type  Obstructive sleep apnea  -Patient advised to return or notify a doctor immediately if symptoms worsen or persist or new concerns arise.  There are no Patient Instructions on file for this visit.  Colin Benton R., DO

## 2016-06-28 ENCOUNTER — Ambulatory Visit: Payer: Medicare HMO | Admitting: Family Medicine

## 2016-06-28 ENCOUNTER — Ambulatory Visit (INDEPENDENT_AMBULATORY_CARE_PROVIDER_SITE_OTHER): Payer: Medicare HMO | Admitting: Family Medicine

## 2016-06-28 ENCOUNTER — Encounter: Payer: Self-pay | Admitting: Family Medicine

## 2016-06-28 VITALS — BP 150/110 | HR 91 | Temp 98.6°F | Ht 71.0 in | Wt 227.2 lb

## 2016-06-28 DIAGNOSIS — I10 Essential (primary) hypertension: Secondary | ICD-10-CM | POA: Diagnosis not present

## 2016-06-28 DIAGNOSIS — Z6831 Body mass index (BMI) 31.0-31.9, adult: Secondary | ICD-10-CM

## 2016-06-28 DIAGNOSIS — E785 Hyperlipidemia, unspecified: Secondary | ICD-10-CM

## 2016-06-28 DIAGNOSIS — K589 Irritable bowel syndrome without diarrhea: Secondary | ICD-10-CM | POA: Insufficient documentation

## 2016-06-28 DIAGNOSIS — R7303 Prediabetes: Secondary | ICD-10-CM | POA: Diagnosis not present

## 2016-06-28 DIAGNOSIS — G4733 Obstructive sleep apnea (adult) (pediatric): Secondary | ICD-10-CM | POA: Insufficient documentation

## 2016-06-28 DIAGNOSIS — K6289 Other specified diseases of anus and rectum: Secondary | ICD-10-CM

## 2016-06-28 MED ORDER — HYDROCHLOROTHIAZIDE 25 MG PO TABS: 25.0000 mg | ORAL_TABLET | Freq: Every day | ORAL | 3 refills | Status: DC

## 2016-06-28 MED ORDER — HYDROCHLOROTHIAZIDE 25 MG PO TABS
25.0000 mg | ORAL_TABLET | Freq: Every day | ORAL | 3 refills | Status: DC
Start: 2016-06-28 — End: 2016-06-28

## 2016-06-28 MED ORDER — DICYCLOMINE HCL 10 MG PO CAPS: ORAL_CAPSULE | ORAL | 2 refills | Status: DC

## 2016-06-28 NOTE — Progress Notes (Signed)
Pre visit review using our clinic review tool, if applicable. No additional management support is needed unless otherwise documented below in the visit note. 

## 2016-06-28 NOTE — Patient Instructions (Signed)
BEFORE YOU LEAVE: -follow up: MORNING PHYSICAL IN 1 month - come fasting IF morning appointment.  Take the Hydrochlorthiazide (water/blood pressure pill) 25mg  every morning.  -We placed a referral for you as discussed to the gastroenterologist. It usually takes about 1-2 weeks to process and schedule this referral. If you have not heard from [place] regarding this appointment in 2 weeks please contact our office.   We recommend the following healthy lifestyle for LIFE: 1) Small portions.   Tip: eat off of a salad plate instead of a dinner plate.  Tip: It is ok to feel hungry after a meal - that likely means you ate an appropriate portion.  Tip: if you need more or a snack choose fruits, veggies and/or a handful of nuts or seeds.  2) Eat a healthy clean diet.  * Tip: Avoid (less then 1 serving per week): processed foods, sweets, sweetened drinks, white starches (rice, flour, bread, potatoes, pasta, etc), red meat, fast foods, butter  *Tip: CHOOSE instead   * 5-9 servings per day of fresh or frozen fruits and vegetables (but not corn, potatoes, bananas, canned or dried fruit)   *nuts and seeds, beans   *olives and olive oil   *small portions of lean meats such as fish and white chicken    *small portions of whole grains  3)Get at least 150 minutes of sweaty aerobic exercise per week.  4)Reduce stress - consider counseling, meditation and relaxation to balance other aspects of your life.

## 2016-06-28 NOTE — Progress Notes (Addendum)
HPI:  Brett Tate. is a pleasant 70 yo whom missed his appt time this morning, but we worked him in this afternoon. PMH sig for htn, hld, hyperglycemia, OSA, prostate ca - sees urologist, IBS and poor compliance. Reports he wants to see a specialist about his "hemorrhoids." Refuses to let me do exam as reports "I know what this is" " I'll spare you looking at my ass." Reports chronic issues with flares of rectal discomfort. Has a dx of IBS from prior PCP and takes bentyl for this. Reports symptoms stable and controlled with bentyl. Saw my colleague this spring and was supposed to follow up weeks later on his BP but did not and reports lisinopril gave him "all kinds of symptoms" so he stopped it and currently takes nothing for his blood pressure.No CP, SOB, DOE. Admits diet is not great and gets no regular exercise. Has gain "10 lbs" in the last year. Reports uses his CPAP nightly.  ROS: See pertinent positives and negatives per HPI.  Past Medical History:  Diagnosis Date  . Chronic diarrhea   . Dyslipidemia   . Elevated prostate specific antigen (PSA)   . Hearing difficulty of both ears    wears hearing aids  . History of tobacco use   . Hypertrophy of prostate without urinary obstruction and other lower urinary tract symptoms (LUTS)   . Osteoarthritis   . Prostate cancer ([place]) 11/16/2012   T1c Gleason 6 prostate cancer  . Sleep apnea    CPAP Machine   . Unspecified intestinal obstruction     Past Surgical History:  Procedure Laterality Date  . ANKLE SURGERY    . APPENDECTOMY  1981   Also had intestinal tumor removed  . COLON RESECTION  1980   fibroid  . KNEE ARTHROSCOPY W/ MENISCAL REPAIR  2012   left knee  . PROSTATE BIOPSY  11/16/2012    Family History  Problem Relation Age of Onset  . Arthritis    . Colon cancer Maternal Grandfather   . Heart disease Maternal Grandfather   . Irritable bowel syndrome Mother   . Cancer Neg Hx     Social History   Social  History  . Marital status: Married    Spouse name: N/A  . Number of children: 1  . Years of education: N/A   Occupational History  . Professor Acupuncturist   Social History Main Topics  . Smoking status: Former Smoker    Packs/day: 2.00    Years: 43.00    Types: Cigarettes    Quit date: 08/13/2008  . Smokeless tobacco: Never Used  . Alcohol use No  . Drug use: No  . Sexual activity: Not Asked   Other Topics Concern  . None   Social History Narrative   Daily caffeine       Work or School: business, teaches, used to [employer] Situation: lives with wife      Spiritual Beliefs: none      Lifestyle: regular exercise and healthy diet              Current Outpatient Prescriptions:  .  AMBULATORY NON FORMULARY MEDICATION, CPAP MACHINE, Disp: , Rfl:  .  dicyclomine (BENTYL) 10 MG capsule, TAKE 1 CAPSULE BY MOUTH 3 TIMES DAILY AS NEEDED, Disp: 90 capsule, Rfl: 2 .  dicyclomine (BENTYL) 10 MG capsule, TAKE 1 CAPSULE BY MOUTH 3 TIMES DAILY AS NEEDED, Disp: 90  capsule, Rfl: 2 .  fluticasone (FLONASE) 50 MCG/ACT nasal spray, Place 2 sprays into both nostrils daily., Disp: 16 g, Rfl: 1 .  ibuprofen (ADVIL,MOTRIN) 200 MG tablet, Take 200 mg by mouth every 6 (six) hours as needed., Disp: , Rfl:  .  Naproxen (NAPROSYN PO), Take by mouth daily., Disp: , Rfl:  .  NON FORMULARY, Apricot seed, Disp: , Rfl:  .  NON FORMULARY, Indole-3-carbinole, Disp: , Rfl:  .  NON FORMULARY, Leitril, Disp: , Rfl:  .  hydrochlorothiazide (HYDRODIURIL) 25 MG tablet, Take 1 tablet (25 mg total) by mouth daily., Disp: 90 tablet, Rfl: 3  EXAM:  Vitals:   06/28/16 1449  BP: (!) 150/110  Pulse: 91  Temp: 98.6 F (37 C)    Body mass index is 31.69 kg/m.  GENERAL: vitals reviewed and listed above, alert, oriented, appears well hydrated and in no acute distress  HEENT: atraumatic, conjunttiva clear, no obvious abnormalities on inspection of external nose and ears  NECK: no  obvious masses on inspection  LUNGS: clear to auscultation bilaterally, no wheezes, rales or rhonchi, good air movement  CV: HRRR, no peripheral edema  DRE: refused  MS: moves all extremities without noticeable abnormality  PSYCH: pleasant and cooperative, no obvious depression or anxiety  ASSESSMENT AND PLAN:  Discussed the following assessment and plan:  Essential hypertension  Dyslipidemia  Prediabetes  BMI 31.0-31.9,adult  Rectal pain - Plan: Ambulatory referral to Gastroenterology  Irritable bowel syndrome, unspecified type - Plan: Ambulatory referral to Gastroenterology   -discussed implications elevated BP and treatment options - he agreed to hctz, lifestyle recs as well for this and obesity -needs labs, preventive care, he promises to do physical in 1 month and do labs then -he refuses exam for his rectal complaints and request referral for hemorrhoid tx - referral placed -Patient advised to return or notify a doctor immediately if symptoms worsen or persist or new concerns arise.  Patient Instructions  BEFORE YOU LEAVE: -follow up: MORNING PHYSICAL IN 1 month - come fasting IF morning appointment.  Take the Hydrochlorthiazide (water/blood pressure pill) 25mg  every morning.  -We placed a referral for you as discussed to the gastroenterologist. It usually takes about 1-2 weeks to process and schedule this referral. If you have not heard from [place] regarding this appointment in 2 weeks please contact our office.   We recommend the following healthy lifestyle for LIFE: 1) Small portions.   Tip: eat off of a salad plate instead of a dinner plate.  Tip: It is ok to feel hungry after a meal - that likely means you ate an appropriate portion.  Tip: if you need more or a snack choose fruits, veggies and/or a handful of nuts or seeds.  2) Eat a healthy clean diet.  * Tip: Avoid (less then 1 serving per week): processed foods, sweets, sweetened drinks, white starches  (rice, flour, bread, potatoes, pasta, etc), red meat, fast foods, butter  *Tip: CHOOSE instead   * 5-9 servings per day of fresh or frozen fruits and vegetables (but not corn, potatoes, bananas, canned or dried fruit)   *nuts and seeds, beans   *olives and olive oil   *small portions of lean meats such as fish and white chicken    *small portions of whole grains  3)Get at least 150 minutes of sweaty aerobic exercise per week.  4)Reduce stress - consider counseling, meditation and relaxation to balance other aspects of your life.    Colin Benton R., DO

## 2016-06-29 ENCOUNTER — Ambulatory Visit: Payer: Medicare HMO | Admitting: Family Medicine

## 2016-07-04 ENCOUNTER — Encounter: Payer: Self-pay | Admitting: Gastroenterology

## 2016-07-04 ENCOUNTER — Ambulatory Visit (INDEPENDENT_AMBULATORY_CARE_PROVIDER_SITE_OTHER): Payer: Medicare HMO | Admitting: Gastroenterology

## 2016-07-04 ENCOUNTER — Other Ambulatory Visit (INDEPENDENT_AMBULATORY_CARE_PROVIDER_SITE_OTHER): Payer: Medicare HMO

## 2016-07-04 VITALS — BP 150/94 | HR 90 | Ht 71.0 in | Wt 226.2 lb

## 2016-07-04 DIAGNOSIS — L290 Pruritus ani: Secondary | ICD-10-CM | POA: Diagnosis not present

## 2016-07-04 DIAGNOSIS — Z800 Family history of malignant neoplasm of digestive organs: Secondary | ICD-10-CM

## 2016-07-04 DIAGNOSIS — R197 Diarrhea, unspecified: Secondary | ICD-10-CM

## 2016-07-04 LAB — IGA: IGA: 215 mg/dL (ref 68–378)

## 2016-07-04 MED ORDER — NA SULFATE-K SULFATE-MG SULF 17.5-3.13-1.6 GM/177ML PO SOLN: 1.0000 | Freq: Once | ORAL | 0 refills | Status: AC

## 2016-07-04 NOTE — Progress Notes (Signed)
07/04/2016 Brett Tate [id number] 1945/11/17  Review of GI history:  He has had multiple gastrointestinal procedures, tests in [place] dating back at least to 2011:  In 2013, Feb he underwent double balloon enteroscopy at [facility] of both the upper and lower GI tract. The upper tract was normal deep into the jejunum and ileum. The lower tract port states and area of edema in the right colon coinciding to previous remote surgery, this was biopsied and there also random biopsies taken from his colon. Both of these sets of biopsies suggested chronic, active inflammation colitis.  In 10/2009 he underwent capsule endoscopy with some edematous small bowel and capsule retention, it appears the capsule eventually passed through without need for surgery.  In February 2011 he underwent colonoscopy and upper endoscopy. Colonoscopy describes some diverticulosis but was otherwise normal. The upper endoscopy found some mild gastritis. Biopsies were taken showing no H. pylori, duodenal biopsies were all normal. The indication for this procedure was iron deficiency anemia.  Was entered for colonoscopy recall for February 2018 for family history of colon cancer.  HISTORY OF PRESENT ILLNESS:  He is here today with complaints of perianal discomfort, itching, burning. He says that he has had some stool leakage. Is unsure if he has hemorrhoids. Denies any bleeding. He tells me that he thinks that he has irritable bowel syndrome. Was having diarrhea multiple times per day, 10-12 times per day, but has modified his diet and has only been eating meat, fruits, vegetables and his symptoms have significantly improved/resolved. He cut out most breads and pastas, although he still does eat some baked goods. He is still having 3-4 bowel movements a day but says that they are formed. Despite having these formed stools he does get some leakage of stool on occasion.  Complaints of a lot of bloating/gas.    Past  Medical History:  Diagnosis Date  . Chronic diarrhea   . Dyslipidemia   . Elevated prostate specific antigen (PSA)   . Hearing difficulty of both ears    wears hearing aids  . History of tobacco use   . Hypertrophy of prostate without urinary obstruction and other lower urinary tract symptoms (LUTS)   . Osteoarthritis   . Prostate cancer ([place]) 11/16/2012   T1c Gleason 6 prostate cancer  . Sleep apnea    CPAP Machine   . Unspecified intestinal obstruction    Past Surgical History:  Procedure Laterality Date  . ANKLE SURGERY    . APPENDECTOMY  1981   Also had intestinal tumor removed  . COLON RESECTION  1980   fibroid  . KNEE ARTHROSCOPY W/ MENISCAL REPAIR  2012   left knee  . PROSTATE BIOPSY  11/16/2012    reports that he quit smoking about 7 years ago. His smoking use included Cigarettes. He has a 86.00 pack-year smoking history. He has never used smokeless tobacco. He reports that he does not drink alcohol or use drugs. family history includes Colon cancer in his maternal grandfather; Heart disease in his maternal grandfather; Irritable bowel syndrome in his mother. No Known Allergies    Outpatient Encounter Prescriptions as of 07/04/2016  Medication Sig  . AMBULATORY NON FORMULARY MEDICATION CPAP MACHINE  . dicyclomine (BENTYL) 10 MG capsule TAKE 1 CAPSULE BY MOUTH 3 TIMES DAILY AS NEEDED  . hydrochlorothiazide (HYDRODIURIL) 25 MG tablet Take 1 tablet (25 mg total) by mouth daily.  . Na Sulfate-K Sulfate-Mg Sulf (SUPREP BOWEL PREP KIT) 17.5-3.13-1.6 GM/180ML  SOLN Take 1 kit by mouth once.  . [DISCONTINUED] dicyclomine (BENTYL) 10 MG capsule TAKE 1 CAPSULE BY MOUTH 3 TIMES DAILY AS NEEDED (Patient not taking: Reported on 07/04/2016)  . [DISCONTINUED] fluticasone (FLONASE) 50 MCG/ACT nasal spray Place 2 sprays into both nostrils daily. (Patient not taking: Reported on 07/04/2016)  . [DISCONTINUED] ibuprofen (ADVIL,MOTRIN) 200 MG tablet Take 200 mg by mouth every 6 (six) hours as  needed.  . [DISCONTINUED] Naproxen (NAPROSYN PO) Take by mouth daily.  . [DISCONTINUED] NON FORMULARY Apricot seed  . [DISCONTINUED] NON FORMULARY Indole-3-carbinole  . [DISCONTINUED] NON FORMULARY Leitril   No facility-administered encounter medications on file as of 07/04/2016.      REVIEW OF SYSTEMS  : All other systems reviewed and negative except where noted in the History of Present Illness.   PHYSICAL EXAM: BP (!) 150/94   Pulse 90   Ht 5' 11"  (1.803 m)   Wt 226 lb 3.2 oz (102.6 kg)   BMI 31.55 kg/m  General: Well developed white male in no acute distress Head: Normocephalic and atraumatic Eyes:  Sclerae anicteric, conjunctiva pink. Ears: Normal auditory acuity Lungs: Clear throughout to auscultation Heart: Regular rate and rhythm Abdomen: Soft, non-distended.  Normal bowel sounds.  Non-tender. Rectal:  Perianal irritation noted.  No external hemorrhoids but he does have what appears to be a large skin tag but also has some appearance of a possible condyloma.  Some light brown stool seen peri-anally.  DRE did not reveal any masses.  Light brown stool on exam glove was heme negative.  Musculoskeletal: Symmetrical with no gross deformities  Skin: No lesions on visible extremities Extremities: No edema  Neurological: Alert oriented x 4, grossly non-focal Psychological:  Alert and cooperative. Normal mood and affect  ASSESSMENT AND PLAN: -Family history of colon cancer:  Is entered for a colonoscopy recall for 09/2016.  Will go ahead and schedule his procedure with Dr. Ardis Hughs while he is here today. -Perianal lesion:  This could just be a large skin tag but also appears like a possible condyloma.  I discussed with him regard referral to surgery for evaluation but he would like to wait until his colonoscopy (at which time Dr. Ardis Hughs and evaluate it as well). -Diarrhea:  Says that it is resolved on a very limited gluten containing diet.  Will check celiac labs.  There was some  mention of possible Crohn's disease from previous evaluation but Dr. Ardis Hughs did not necessarily concur with that diagnosis from the review of his records. -Anal leakage:  Will Have him begin taking a daily powder fiber supplement such as Benefiber or Citrucel. This leakage is causing him to have perianal itching and irritation.  I have recommended that he use Desitin or other zinc oxide based product after each bowel movement and at bedtime.   CC:  Lucretia Kern, DO

## 2016-07-04 NOTE — Patient Instructions (Signed)
You have been scheduled for a colonoscopy. Please follow written instructions given to you at your visit today.  Please pick up your prep supplies at the pharmacy within the next 1-3 days. If you use inhalers (even only as needed), please bring them with you on the day of your procedure. Your physician has requested that you go to www.startemmi.com and enter the access code given to you at your visit today. This web site gives a general overview about your procedure. However, you should still follow specific instructions given to you by our office regarding your preparation for the procedure.  Go to the basement for labs today  Start benefiber or Citrucel  daily  Use Desitin or other zinc oxide product for perianal irritation after bowel movements and at bedtime

## 2016-07-06 LAB — TISSUE TRANSGLUTAMINASE, IGA: Tissue Transglutaminase Ab, IgA: 1 U/mL (ref <4)

## 2016-07-08 NOTE — Progress Notes (Signed)
I agree with the above note, plan 

## 2016-07-25 DIAGNOSIS — I10 Essential (primary) hypertension: Secondary | ICD-10-CM | POA: Diagnosis not present

## 2016-07-25 DIAGNOSIS — Z0000 Encounter for general adult medical examination without abnormal findings: Secondary | ICD-10-CM | POA: Diagnosis not present

## 2016-08-09 NOTE — Progress Notes (Signed)
Medicare Annual Preventive Care Visit  (initial annual wellness or annual wellness exam)  Concerns and/or follow up today:  PMH HTN, OSA, IBS, Obesity, prediabetes, HLD, poor compliance, prostate ca, perianal pruritis. Saw GI and will be having colonosocpy. Hx of prosate ca, followed [facility]. He agrees to schedule follow up. Denies any LUTS. Started hctz last visit for HTN, but taking sporadically - reports check BP at home and always good after sitting 120/80. Due for labs, hep c screening, pneumonia vaccine, shingles vaccine. Declined shingles after discussion.  -Diet: variety of foods, balance and well rounded - reports cut out sugar and simple carbs since last visit and has lost weight and is feeling better. Eating a lot of coconut oil.  -Exercise: walking 1/2-1 mile daily  -Diabetes and Dyslipidemia Screening:FASTING for labs  -Vaccines: see HM  -sexual activity: yes, male partner, no new partners  -wants STI testing, Hep C screening (if born 62-1964): agreed to hep c testing  -FH colon or prstate ca: see FH Last colon cancer screening: done, doing colonoscopy with GI soon Last prostate ca screening: sees urologist  -Alcohol, Tobacco, drug use: see social history  Review of Systems - no fevers, unintentional weight loss, vision loss, hearing loss, chest pain, sob, hemoptysis, melena, hematochezia, hematuria, genital discharge, changing or concerning skin lesions, bleeding, bruising, loc, thoughts of self harm or SI   ROS: negative for report of fevers, unintentional weight loss, vision changes, vision loss, hearing loss or change, chest pain, sob, hemoptysis, melena, hematochezia, hematuria, genital discharge or lesions, falls, bleeding or bruising, loc, thoughts of suicide or self harm, memory loss  1.) Patient-completed health risk assessment  - completed and reviewed, see scanned documentation  2.) Review of Medical History: -PMH, PSH, Family History and  current specialty and care providers reviewed and updated and listed below  - see scanned in document in chart and below  Past Medical History:  Diagnosis Date  . Chronic diarrhea   . Dyslipidemia   . Elevated prostate specific antigen (PSA)   . Hearing difficulty of both ears    wears hearing aids  . History of tobacco use   . Hypertrophy of prostate without urinary obstruction and other lower urinary tract symptoms (LUTS)   . Osteoarthritis   . Prostate cancer ([place]) 11/16/2012   T1c Gleason 6 prostate cancer  . Sleep apnea    CPAP Machine   . Unspecified intestinal obstruction     Past Surgical History:  Procedure Laterality Date  . ANKLE SURGERY    . APPENDECTOMY  1981   Also had intestinal tumor removed  . COLON RESECTION  1980   fibroid  . KNEE ARTHROSCOPY W/ MENISCAL REPAIR  2012   left knee  . PROSTATE BIOPSY  11/16/2012    Social History   Social History  . Marital status: Married    Spouse name: N/A  . Number of children: 1  . Years of education: N/A   Occupational History  . Professor Acupuncturist   Social History Main Topics  . Smoking status: Former Smoker    Packs/day: 2.00    Years: 43.00    Types: Cigarettes    Quit date: 08/13/2008  . Smokeless tobacco: Never Used  . Alcohol use No  . Drug use: No  . Sexual activity: Not on file   Other Topics Concern  . Not on file   Social History Narrative   Daily caffeine  Work or School: business, teaches, used to [employer] Situation: lives with wife      Spiritual Beliefs: none      Lifestyle: regular exercise and healthy diet             Family History  Problem Relation Age of Onset  . Irritable bowel syndrome Mother   . Arthritis    . Colon cancer Maternal Grandfather   . Heart disease Maternal Grandfather   . Cancer Neg Hx     Current Outpatient Prescriptions on File Prior to Visit  Medication Sig Dispense Refill  . AMBULATORY NON FORMULARY MEDICATION  CPAP MACHINE    . dicyclomine (BENTYL) 10 MG capsule TAKE 1 CAPSULE BY MOUTH 3 TIMES DAILY AS NEEDED 90 capsule 2  . hydrochlorothiazide (HYDRODIURIL) 25 MG tablet Take 1 tablet (25 mg total) by mouth daily. 90 tablet 3   No current facility-administered medications on file prior to visit.      3.) Review of functional ability and level of safety:  Any difficulty hearing?  See scanned documentation  History of falling?  See scanned documentation  Any trouble with IADLs - using a phone, using transportation, grocery shopping, preparing meals, doing housework, doing laundry, taking medications and managing money?  See scanned documentation  Advance Directives? Reports has living will and HCPOA  See summary of recommendations in Patient Instructions below.  4.) Physical Exam Vitals:   08/10/16 0858  BP: 126/74  Pulse: 95  Temp: 97.7 F (36.5 C)   Estimated body mass index is 31.67 kg/m as calculated from the following:   Height as of this encounter: 5' 9.25" (1.759 m).   Weight as of this encounter: 216 lb (98 kg).  EKG (optional): deferred  EXAM:  Vitals:   08/10/16 0858  BP: 126/74  Pulse: 95  Temp: 97.7 F (36.5 C)  TempSrc: Oral  Weight: 216 lb (98 kg)  Height: 5' 9.25" (1.759 m)    Estimated body mass index is 31.67 kg/m as calculated from the following:   Height as of this encounter: 5' 9.25" (1.759 m).   Weight as of this encounter: 216 lb (98 kg).  GENERAL: vitals reviewed and listed below, alert, oriented, appears well hydrated and in no acute distress  HEENT: head atraumatic, wears hearing aides, visual acuity grossly intact, PERRLA, normal appearance of eyes, ears, nose and mouth. moist mucus membranes.  NECK: supple, no masses or lymphadenopathy  LUNGS: clear to auscultation bilaterally, no rales, rhonchi or wheeze  CV: HRRR, no peripheral edema or cyanosis, normal pedal pulses  ABDOMEN: bowel sounds normal, soft, non tender to palpation, no  masses, no rebound or guarding  GU/DR: declined, plans to see urologist  SKIN: no rash or abnormal lesions  MS: normal gait, moves all extremities normally  NEURO: normal gait, speech and thought processing grossly intact, muscle tone grossly intact throughout  PSYCH: normal affect, pleasant and cooperative   Cognitive function grossly intact  See patient instructions for recommendations.  Education and counseling regarding the above review of health provided with a plan for the following: -see scanned patient completed form for further details -fall prevention strategies discussed  -healthy lifestyle discussed -importance and resources for completing advanced directives discussed -see patient instructions below for any other recommendations provided  4)The following written screening schedule of preventive measures were reviewed with assessment and plan made per below, orders and patient instructions:      AAA screening done if applicable  Alcohol screening done     Obesity Screening and counseling done     STI screening (Hep C if born [birth year]) offered and per pt wishes     Tobacco Screening done        Pneumococcal (PPSV23 -one dose after 64, one before if risk factors), influenza yearly and hepatitis B vaccines (if high risk - end stage renal disease, IV drugs, homosexual men, live in home for mentally retarded, hemophilia receiving factors) ASSESSMENT/PLAN: done today      Prostate cancer screening ASSESSMENT/PLAN: sees urologist and agrees to schedule follow up      Colorectal cancer screening (FOBT yearly or flex sig q4y or colonoscopy q10y or barium enema q4y) ASSESSMENT/PLAN: utd, seeing GI, colonoscopy planned      Diabetes outpatient self-management training services ASSESSMENT/PLAN: utd or done      Screening for glaucoma(q1y if high risk - diabetes, FH, AA and > 29 or hispanic and > 65) ASSESSMENT/PLAN: scheduled for eye exam      Medical nutritional  therapy for individuals with diabetes or renal disease ASSESSMENT/PLAN: see orders      Cardiovascular screening blood tests (lipids q5y) ASSESSMENT/PLAN: see orders and labs      Diabetes screening tests ASSESSMENT/PLAN: see orders and labs   7.) Summary:   Medicare annual wellness visit, subsequent -risk factors and conditions per above assessment were discussed and treatment, recommendations and referrals were offered per documentation above and orders and patient instructions.  Other problems related to lifestyle - Plan: Hepatitis C antibody  Dyslipidemia - Plan: Lipid panel -may need to stop coconut oil if cholesterol elevated, congratulated on other lifestyle changes  Hyperglycemia - Plan: Hemoglobin A1c -lifestyle changes, congratulated on cutting out sugarS  Essential hypertension - Plan: CMP with eGFR -he wants to stop the BP med, BP elevated on arrival, improve with sitting, advise to cont hctz for now  BMI 31.0-31.9,adult -lifestyle recs  Malignant neoplasm of prostate ([place])  Need for prophylactic vaccination against Streptococcus pneumoniae (pneumococcus) - Plan: Pneumococcal polysaccharide vaccine 23-valent greater than or equal to 2yo subcutaneous/IM  Patient Instructions  BEFORE YOU LEAVE: -pneumonia vaccine -[facility] phone number for patient -labs -follow up: 3-4 months  [place].  We have ordered labs or studies at this visit. It can take up to 1-2 weeks for results and processing. IF results require follow up or explanation, we will call you with instructions. Clinically stable results will be released to your [facility]. If you have not heard from [place] or cannot find your results in [facility] in 2 weeks please contact our office at [phone].  If you are not yet signed up for [facility], please consider signing up.   We recommend the following healthy lifestyle for LIFE: 1) Small portions.   Tip: eat off of a  salad plate instead of a dinner plate.  Tip: It is ok to feel hungry after a meal   Tip: if you need more or a snack choose fruits, veggies and/or a handful of nuts or seeds.  2) Eat a healthy clean diet.  * Tip: Avoid (less then 1 serving per week): processed foods, sweets, sweetened drinks, white starches (rice, flour, bread, potatoes, pasta, etc), red meat, fast foods, butter  *Tip: CHOOSE instead   * 5-9 servings per day of fresh or frozen fruits and vegetables (but not corn, potatoes, bananas, canned or dried fruit)   *nuts and seeds, beans   *olives and olive oil   *  small portions of lean meats such as fish and white chicken    *small portions of whole grains  3)Get at least 150 minutes of sweaty aerobic exercise per week.  4)Reduce stress - consider counseling, meditation and relaxation to balance other aspects of your life.           Colin Benton R., DO                         HPI:  Here for CPE:  -Concerns and/or follow up today:   PMH HTN, OSA, IBS, Obesity, prediabetes, HLD, poor compliance, prostate ca, perianal pruritis. Saw GI and will be having colonosocpy. Hx of prosate ca, [facility]. He agrees to schedule follow up. Denies any LUTS. Started hctz last visit for HTN, but taking sporadically - reports check BP at home and always good after sitting 120/80. Due for labs, hep c screening, pneumonia vaccine, shingles vaccine. Declined shingles after discussion.  -Diet: variety of foods, balance and well rounded - reports cut out sugar and simple carbs since last visit and has lost weight and is feeling better. Eating a lot of coconut oil.  -Exercise: walking 1/2-1 mile daily  -Diabetes and Dyslipidemia Screening:FASTING for labs  -Vaccines: see HM  -sexual activity: yes, male partner, no new partners  -wants STI testing, Hep C screening (if born 71-1964): agreed to hep c testing  -FH colon or prstate ca: see  FH Last colon cancer screening: done, doing colonoscopy with GI soon Last prostate ca screening: sees urologist  -Alcohol, Tobacco, drug use: see social history  Review of Systems - no fevers, unintentional weight loss, vision loss, hearing loss, chest pain, sob, hemoptysis, melena, hematochezia, hematuria, genital discharge, changing or concerning skin lesions, bleeding, bruising, loc, thoughts of self harm or SI  Past Medical History:  Diagnosis Date  . Chronic diarrhea   . Dyslipidemia   . Elevated prostate specific antigen (PSA)   . Hearing difficulty of both ears    wears hearing aids  . History of tobacco use   . Hypertrophy of prostate without urinary obstruction and other lower urinary tract symptoms (LUTS)   . Osteoarthritis   . Prostate cancer ([place]) 11/16/2012   T1c Gleason 6 prostate cancer  . Sleep apnea    CPAP Machine   . Unspecified intestinal obstruction     Past Surgical History:  Procedure Laterality Date  . ANKLE SURGERY    . APPENDECTOMY  1981   Also had intestinal tumor removed  . COLON RESECTION  1980   fibroid  . KNEE ARTHROSCOPY W/ MENISCAL REPAIR  2012   left knee  . PROSTATE BIOPSY  11/16/2012    Family History  Problem Relation Age of Onset  . Irritable bowel syndrome Mother   . Arthritis    . Colon cancer Maternal Grandfather   . Heart disease Maternal Grandfather   . Cancer Neg Hx     Social History   Social History  . Marital status: Married    Spouse name: N/A  . Number of children: 1  . Years of education: N/A   Occupational History  . Professor Acupuncturist   Social History Main Topics  . Smoking status: Former Smoker    Packs/day: 2.00    Years: 43.00    Types: Cigarettes    Quit date: 08/13/2008  . Smokeless tobacco: Never Used  . Alcohol use No  . Drug  use: No  . Sexual activity: Not Asked   Other Topics Concern  . None   Social History Narrative   Daily caffeine       Work or School: business,  teaches, used to [employer] Situation: lives with wife      Spiritual Beliefs: none      Lifestyle: regular exercise and healthy diet              Current Outpatient Prescriptions:  .  AMBULATORY NON FORMULARY MEDICATION, CPAP MACHINE, Disp: , Rfl:  .  dicyclomine (BENTYL) 10 MG capsule, TAKE 1 CAPSULE BY MOUTH 3 TIMES DAILY AS NEEDED, Disp: 90 capsule, Rfl: 2 .  hydrochlorothiazide (HYDRODIURIL) 25 MG tablet, Take 1 tablet (25 mg total) by mouth daily., Disp: 90 tablet, Rfl: 3  EXAM:  Vitals:   08/10/16 0858  BP: 126/74  Pulse: 95  Temp: 97.7 F (36.5 C)  TempSrc: Oral  Weight: 216 lb (98 kg)  Height: 5' 9.25" (1.759 m)    Estimated body mass index is 31.67 kg/m as calculated from the following:   Height as of this encounter: 5' 9.25" (1.759 m).   Weight as of this encounter: 216 lb (98 kg).  GENERAL: vitals reviewed and listed below, alert, oriented, appears well hydrated and in no acute distress  HEENT: head atraumatic, PERRLA, normal appearance of eyes, ears, nose and mouth. moist mucus membranes.  NECK: supple, no masses or lymphadenopathy  LUNGS: clear to auscultation bilaterally, no rales, rhonchi or wheeze  CV: HRRR, no peripheral edema or cyanosis, normal pedal pulses  ABDOMEN: bowel sounds normal, soft, non tender to palpation, no masses, no rebound or guarding  GU/DR: declined, plans to see urologist  SKIN: no rash or abnormal lesions  MS: normal gait, moves all extremities normally  NEURO: normal gait, speech and thought processing grossly intact, muscle tone grossly intact throughout  PSYCH: normal affect, pleasant and cooperative  ASSESSMENT AND PLAN:  Discussed the following assessment and plan:  Medicare annual wellness visit, subsequent  Other problems related to lifestyle - Plan: Hepatitis C antibody  Dyslipidemia - Plan: Lipid panel  Hyperglycemia - Plan: Hemoglobin A1c  Essential hypertension - Plan: CMP with eGFR  BMI  31.0-31.9,adult  Malignant neoplasm of prostate ([facility])  Need for prophylactic vaccination against Streptococcus pneumoniae (pneumococcus) - Plan: Pneumococcal polysaccharide vaccine 23-valent greater than or equal to 2yo subcutaneous/IM   -Discussed and advised all [place] preventive services health task force level A and B recommendations for age, sex and risks.  --Advised at least 150 minutes of exercise per week and a healthy diet with avoidance of (less then 1 serving per week) processed foods, white starches, red meat, fast foods and sweets and consisting of: * 5-9 servings of fresh fruits and vegetables (not corn or potatoes) *nuts and seeds, beans *olives and olive oil *lean meats such as fish and white chicken  *whole grains  -labs, studies and vaccines per orders this encounter   Patient advised to return to clinic immediately if symptoms worsen or persist or new concerns.  Patient Instructions  BEFORE YOU LEAVE: -pneumonia vaccine -[facility] phone number for patient -labs -follow up: 3-4 months  [facility].  We have ordered labs or studies at this visit. It can take up to 1-2 weeks for results and processing. IF results require follow up or explanation, we will call you with instructions. Clinically stable results will be released  to your [facility]. If you have not heard from [place] or cannot find your results in [facility] in 2 weeks please contact our office at [phone].  If you are not yet signed up for [facility], please consider signing up.   We recommend the following healthy lifestyle for LIFE: 1) Small portions.   Tip: eat off of a salad plate instead of a dinner plate.  Tip: It is ok to feel hungry after a meal   Tip: if you need more or a snack choose fruits, veggies and/or a handful of nuts or seeds.  2) Eat a healthy clean diet.  * Tip: Avoid (less then 1 serving per week): processed foods, sweets, sweetened drinks, white  starches (rice, flour, bread, potatoes, pasta, etc), red meat, fast foods, butter  *Tip: CHOOSE instead   * 5-9 servings per day of fresh or frozen fruits and vegetables (but not corn, potatoes, bananas, canned or dried fruit)   *nuts and seeds, beans   *olives and olive oil   *small portions of lean meats such as fish and white chicken    *small portions of whole grains  3)Get at least 150 minutes of sweaty aerobic exercise per week.  4)Reduce stress - consider counseling, meditation and relaxation to balance other aspects of your life.           No Follow-up on file.   Colin Benton R., DO

## 2016-08-10 ENCOUNTER — Ambulatory Visit (INDEPENDENT_AMBULATORY_CARE_PROVIDER_SITE_OTHER): Payer: Medicare HMO | Admitting: Family Medicine

## 2016-08-10 ENCOUNTER — Encounter: Payer: Self-pay | Admitting: Family Medicine

## 2016-08-10 VITALS — BP 126/74 | HR 95 | Temp 97.7°F | Ht 69.25 in | Wt 216.0 lb

## 2016-08-10 DIAGNOSIS — Z7289 Other problems related to lifestyle: Secondary | ICD-10-CM

## 2016-08-10 DIAGNOSIS — C61 Malignant neoplasm of prostate: Secondary | ICD-10-CM

## 2016-08-10 DIAGNOSIS — I10 Essential (primary) hypertension: Secondary | ICD-10-CM

## 2016-08-10 DIAGNOSIS — Z6831 Body mass index (BMI) 31.0-31.9, adult: Secondary | ICD-10-CM

## 2016-08-10 DIAGNOSIS — R739 Hyperglycemia, unspecified: Secondary | ICD-10-CM

## 2016-08-10 DIAGNOSIS — E785 Hyperlipidemia, unspecified: Secondary | ICD-10-CM | POA: Diagnosis not present

## 2016-08-10 DIAGNOSIS — Z23 Encounter for immunization: Secondary | ICD-10-CM

## 2016-08-10 DIAGNOSIS — Z0000 Encounter for general adult medical examination without abnormal findings: Secondary | ICD-10-CM

## 2016-08-10 LAB — COMPLETE METABOLIC PANEL WITH GFR
ALBUMIN: 3.5 g/dL — AB (ref 3.6–5.1)
ALK PHOS: 73 U/L (ref 40–115)
ALT: 25 U/L (ref 9–46)
AST: 20 U/L (ref 10–35)
BUN: 8 mg/dL (ref 7–25)
CO2: 25 mmol/L (ref 20–31)
Calcium: 8.3 mg/dL — ABNORMAL LOW (ref 8.6–10.3)
Chloride: 103 mmol/L (ref 98–110)
Creat: 0.85 mg/dL (ref 0.70–1.25)
GFR, EST NON AFRICAN AMERICAN: 89 mL/min (ref >=60)
Glucose, Bld: 118 mg/dL — ABNORMAL HIGH (ref 65–99)
Potassium: 3.4 mmol/L — ABNORMAL LOW (ref 3.5–5.3)
Sodium: 138 mmol/L (ref 135–146)
Total Bilirubin: 0.4 mg/dL (ref 0.2–1.2)
Total Protein: 6.7 g/dL (ref 6.1–8.1)

## 2016-08-10 LAB — HEMOGLOBIN A1C: Hgb A1c MFr Bld: 6.1 % (ref 4.6–6.5)

## 2016-08-10 LAB — LIPID PANEL
CHOLESTEROL: 137 mg/dL (ref 0–200)
HDL: 29.1 mg/dL — ABNORMAL LOW (ref >39.00)
NONHDL: 107.6
Total CHOL/HDL Ratio: 5
Triglycerides: 237 mg/dL — ABNORMAL HIGH (ref 0.0–149.0)
VLDL: 47.4 mg/dL — AB (ref 0.0–40.0)

## 2016-08-10 LAB — LDL CHOLESTEROL, DIRECT: Direct LDL: 77 mg/dL

## 2016-08-10 NOTE — Patient Instructions (Signed)
BEFORE YOU LEAVE: -pneumonia vaccine -[facility] phone number for patient -labs -follow up: 3-4 months  [place] [facility].  We have ordered labs or studies at this visit. It can take up to 1-2 weeks for results and processing. IF results require follow up or explanation, we will call you with instructions. Clinically stable results will be released to your [facility]. If you have not heard from [place] or cannot find your results in [facility] in 2 weeks please contact our office at [phone].  If you are not yet signed up for [facility], please consider signing up.   We recommend the following healthy lifestyle for LIFE: 1) Small portions.   Tip: eat off of a salad plate instead of a dinner plate.  Tip: It is ok to feel hungry after a meal   Tip: if you need more or a snack choose fruits, veggies and/or a handful of nuts or seeds.  2) Eat a healthy clean diet.  * Tip: Avoid (less then 1 serving per week): processed foods, sweets, sweetened drinks, white starches (rice, flour, bread, potatoes, pasta, etc), red meat, fast foods, butter  *Tip: CHOOSE instead   * 5-9 servings per day of fresh or frozen fruits and vegetables (but not corn, potatoes, bananas, canned or dried fruit)   *nuts and seeds, beans   *olives and olive oil   *small portions of lean meats such as fish and white chicken    *small portions of whole grains  3)Get at least 150 minutes of sweaty aerobic exercise per week.  4)Reduce stress - consider counseling, meditation and relaxation to balance other aspects of your life.

## 2016-08-10 NOTE — Progress Notes (Signed)
Pre visit review using our clinic review tool, if applicable. No additional management support is needed unless otherwise documented below in the visit note. 

## 2016-08-11 LAB — HEPATITIS C ANTIBODY: HCV Ab: NEGATIVE

## 2016-08-15 DIAGNOSIS — H353131 Nonexudative age-related macular degeneration, bilateral, early dry stage: Secondary | ICD-10-CM | POA: Diagnosis not present

## 2016-08-15 DIAGNOSIS — H5211 Myopia, right eye: Secondary | ICD-10-CM | POA: Diagnosis not present

## 2016-08-23 ENCOUNTER — Ambulatory Visit: Payer: Medicare HMO | Admitting: Family Medicine

## 2016-09-10 ENCOUNTER — Encounter: Payer: Medicare HMO | Admitting: Gastroenterology

## 2016-09-13 DIAGNOSIS — R972 Elevated prostate specific antigen [PSA]: Secondary | ICD-10-CM | POA: Diagnosis not present

## 2016-09-24 DIAGNOSIS — C61 Malignant neoplasm of prostate: Secondary | ICD-10-CM | POA: Diagnosis not present

## 2016-09-24 DIAGNOSIS — R972 Elevated prostate specific antigen [PSA]: Secondary | ICD-10-CM | POA: Diagnosis not present

## 2016-10-15 ENCOUNTER — Other Ambulatory Visit: Payer: Self-pay | Admitting: Urology

## 2016-10-15 DIAGNOSIS — C61 Malignant neoplasm of prostate: Secondary | ICD-10-CM

## 2016-10-25 IMAGING — CT CT HEAD W/O CM
1 series · 16 of 30 positions shown, 20 images · non-contrast
Comparison: None.

CLINICAL DATA: Right-sided headache

EXAM:
CT HEAD WITHOUT CONTRAST
TECHNIQUE: Contiguous axial images were obtained from the base of the skull
through the vertex without intravenous contrast.

[Series 2: head 4.8 h37s · axial · 0.47mm/px · z∈[-169,-9]mm · 16 of 36 slices shown, 20 images]
[im 2/36  brain]
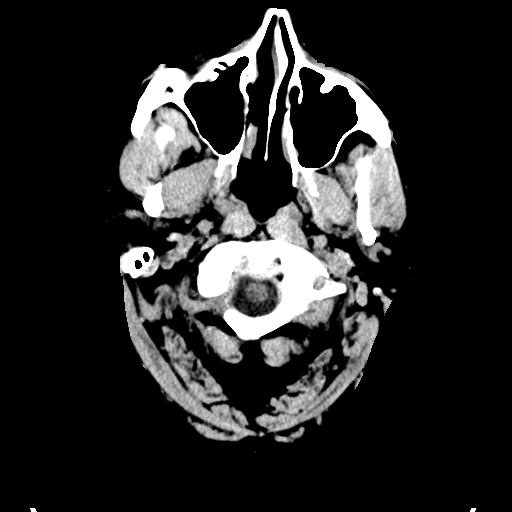
[im 2/36  bone]
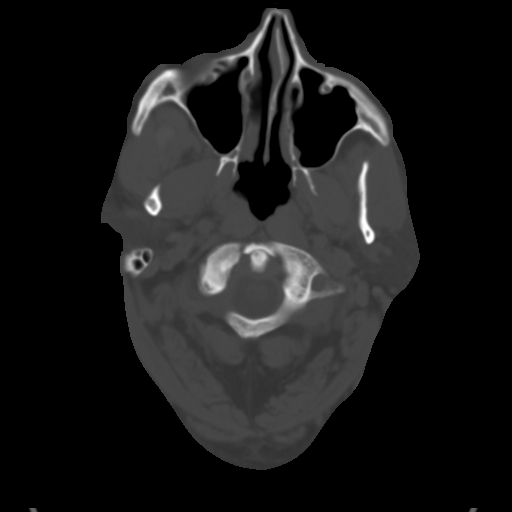
[im 4/36  brain]
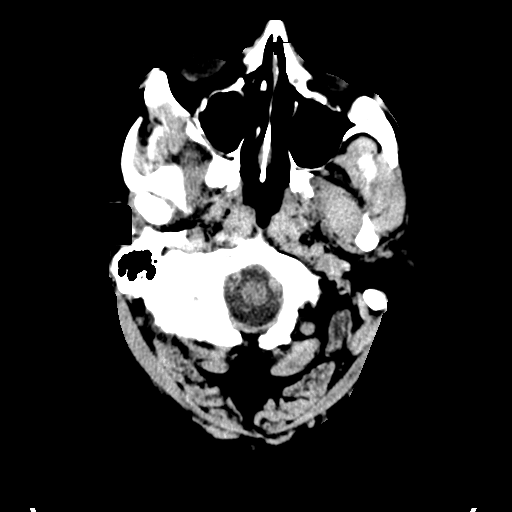
[im 7/36  brain]
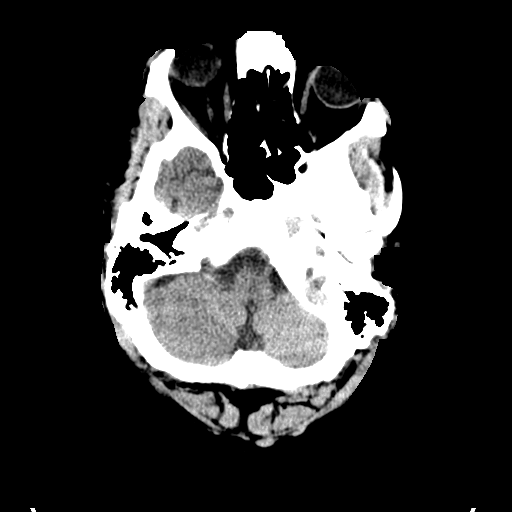
[im 9/36  brain]
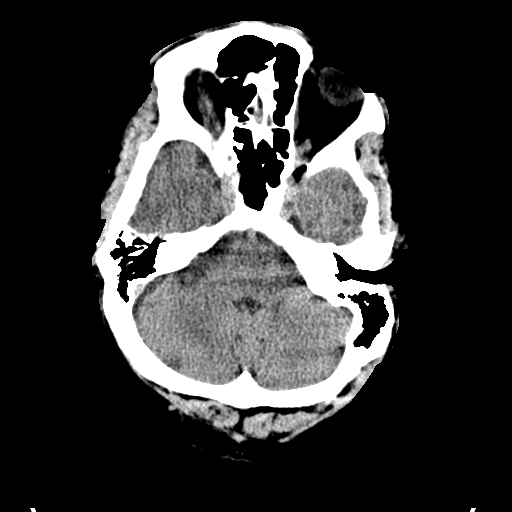
[im 10/36  brain]
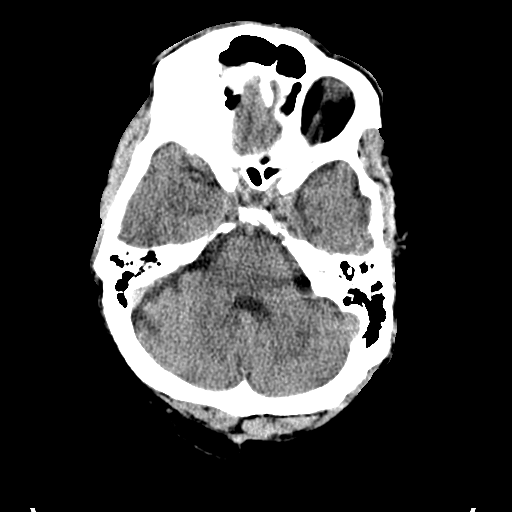
[im 10/36  bone]
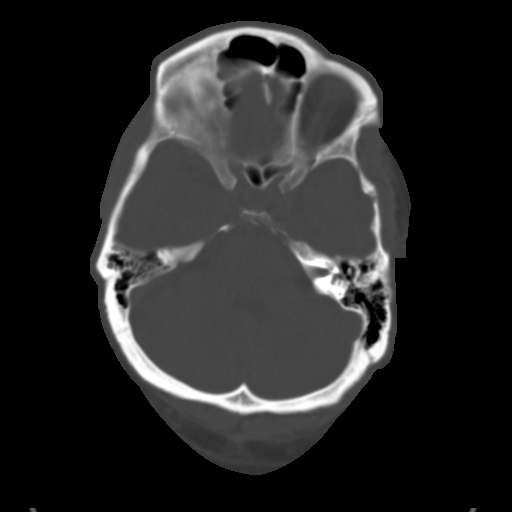
[im 13/36  brain]
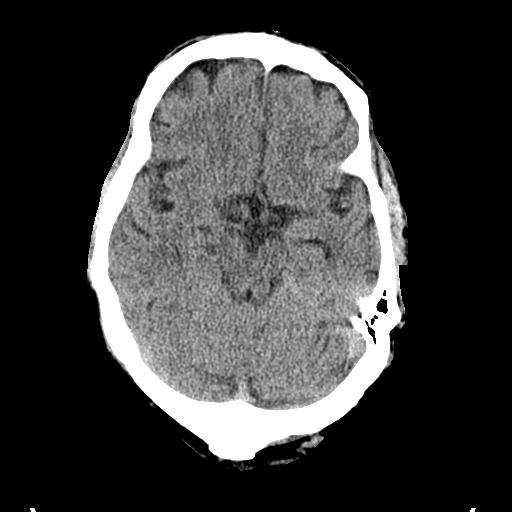
[im 15/36  brain]
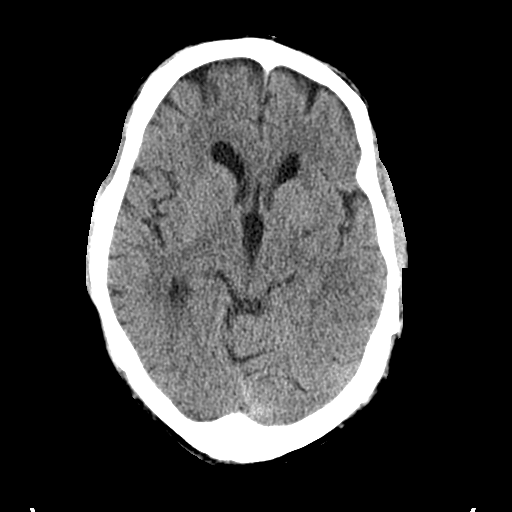
[im 17/36  brain]
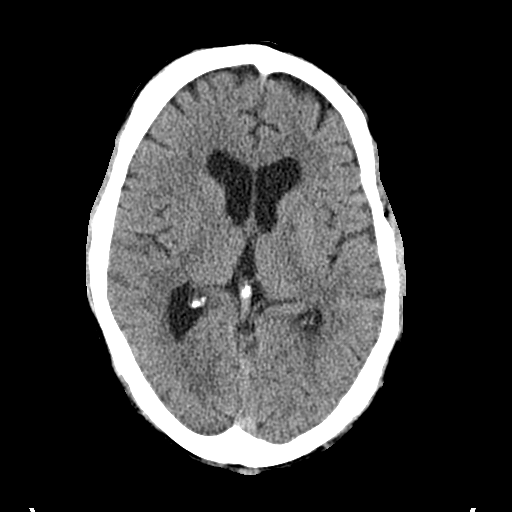
[im 19/36  brain]
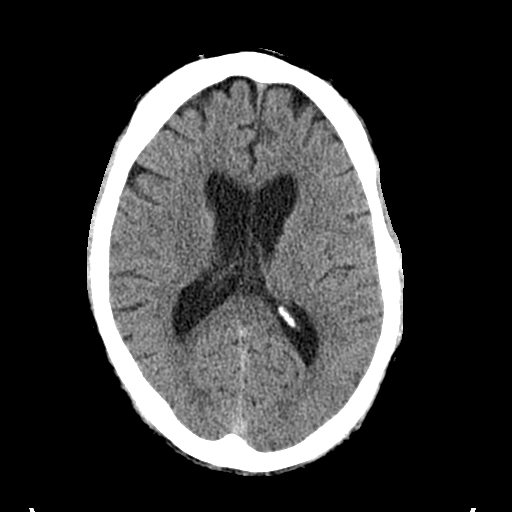
[im 19/36  bone]
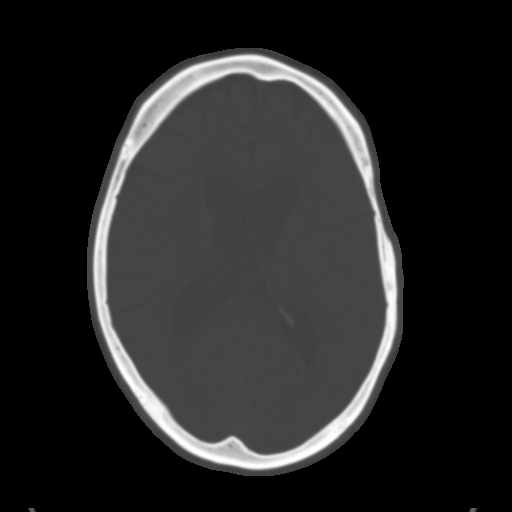
[im 21/36  brain]
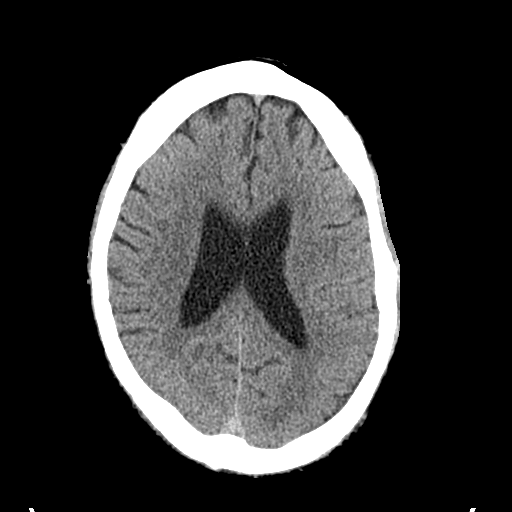
[im 23/36  brain]
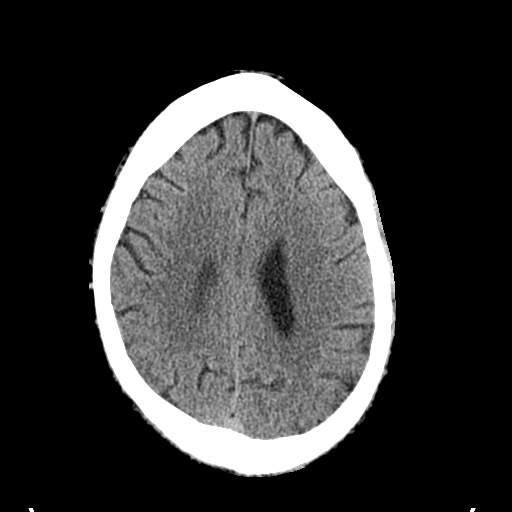
[im 26/36  brain]
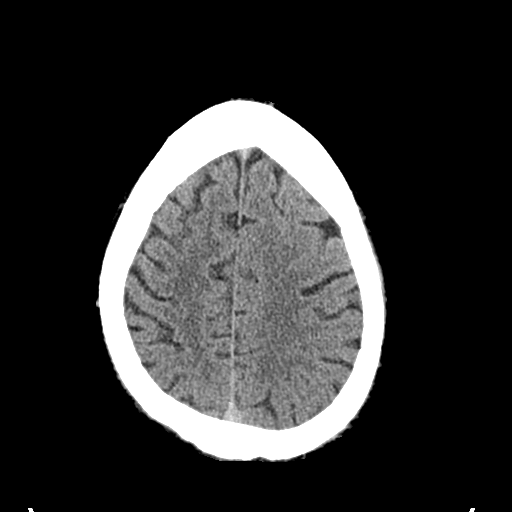
[im 27/36  brain]
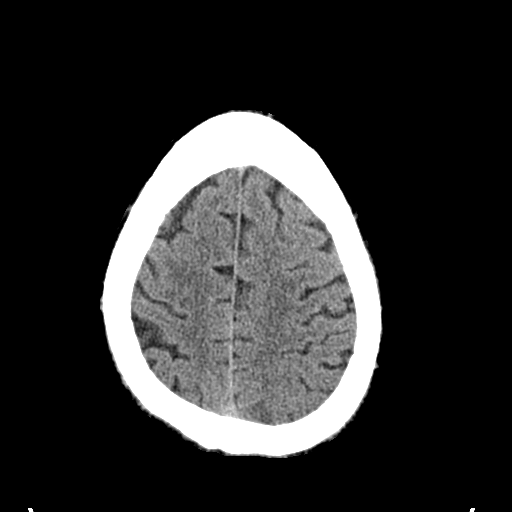
[im 27/36  bone]
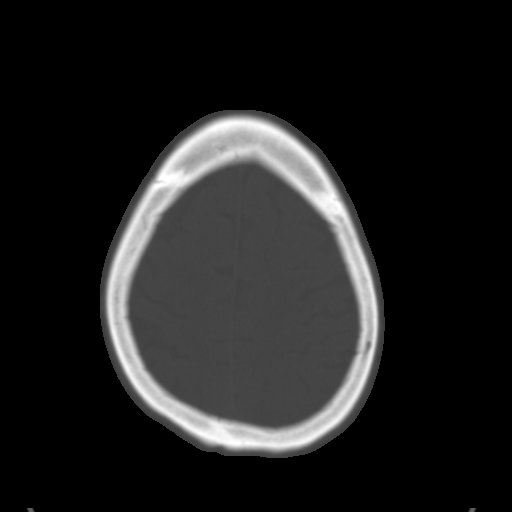
[im 29/36  brain]
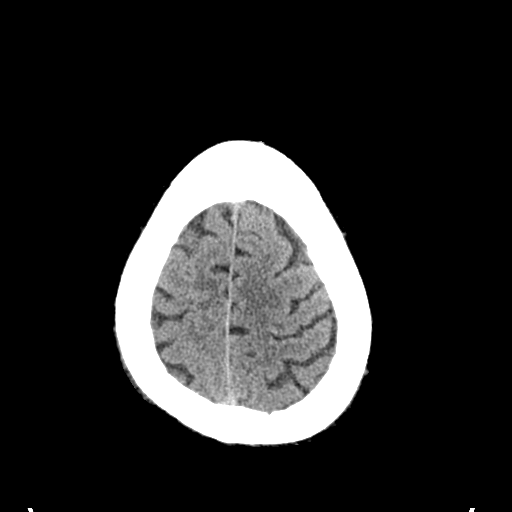
[im 32/36  brain]
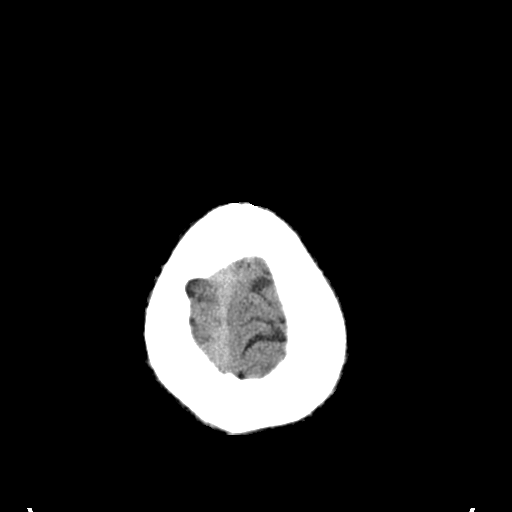
[im 34/36  brain]
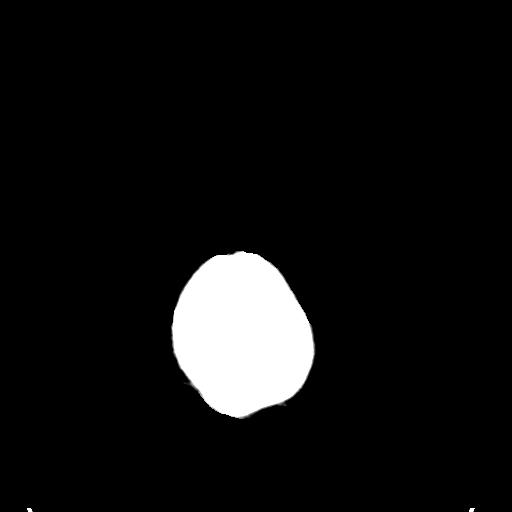

[16 of 30 positions shown; findings below may reference images not displayed]

FINDINGS: There is no evidence of mass effect, midline shift or extra-axial
fluid collections. There is no evidence of a space-occupying lesion
or intracranial hemorrhage. There is no evidence of a cortical-based
area of acute infarction. Small old left basal ganglia lacunar
infarct.

The ventricles and sulci are appropriate for the patient's age. The
basal cisterns are patent.

Visualized portions of the orbits are unremarkable. The visualized
portions of the paranasal sinuses and mastoid air cells are
unremarkable.

The osseous structures are unremarkable.
IMPRESSION: No acute intracranial pathology.

## 2016-11-02 ENCOUNTER — Ambulatory Visit (HOSPITAL_COMMUNITY): Payer: Medicare HMO

## 2016-11-06 ENCOUNTER — Ambulatory Visit (HOSPITAL_COMMUNITY)
Admission: RE | Admit: 2016-11-06 | Discharge: 2016-11-06 | Disposition: A | Discharge status: Home / Self Care | Payer: Medicare HMO | Source: Ambulatory Visit | Attending: Urology | Admitting: Urology

## 2016-11-06 DIAGNOSIS — C61 Malignant neoplasm of prostate: Secondary | ICD-10-CM | POA: Diagnosis not present

## 2016-11-06 LAB — POCT I-STAT CREATININE: CREATININE: 0.9 mg/dL (ref 0.61–1.24)

## 2016-11-06 MED ORDER — GADOBENATE DIMEGLUMINE 529 MG/ML IV SOLN
20.0000 mL | Freq: Once | INTRAVENOUS | Status: AC | PRN
  Administered 2016-11-06: 20 mL via INTRAVENOUS

## 2016-11-06 MED ORDER — LIDOCAINE HCL 2 % EX GEL: 1.0000 "application " | Freq: Once | CUTANEOUS | Status: DC

## 2016-11-06 MED ORDER — LIDOCAINE HCL 2 % EX GEL
CUTANEOUS | Status: AC
  Filled 2016-11-06: qty 30

## 2016-11-08 DIAGNOSIS — L600 Ingrowing nail: Secondary | ICD-10-CM | POA: Diagnosis not present

## 2016-11-08 DIAGNOSIS — M25775 Osteophyte, left foot: Secondary | ICD-10-CM | POA: Diagnosis not present

## 2016-11-19 NOTE — Progress Notes (Signed)
[name] [place], [place] [address] Phone: [phone] Subjective:    I'm seeing this patient by the request  of:    CC: back pain and hand and knee   [id number]  Brett Tate. is a 71 y.o. male coming in with complaint of Back pain. Past medical history is significant for prostate cancer. Patient recently did have a new MRI of the prostate showing highly suspicious for high-grade carcinoma in the peripheral zone lesion.Asian states that he does have a mild dull low back pain. Has responded to manipulation previously. Patient denies any radiation down the legs. Seems to come and go. Continues to be able to plate golf on a regular basis. No weakness of the legs. Rates the severity pain is 3 out of 10. Denies any fever, chills, any abnormal weight loss  Patient is also complaining of left index finger pain. Noticed swelling. Does have pain and can't bend it all the way. Seems to be on the dorsal aspect. Does not remember any true injury.  Left knee pain. Patient states that going up and down stairs can be painful. Seems to be on the anterior aspect of the knee. Hurts with certain movements. Denies any numbness though. Patient denies any giving out on him. Does not have any swelling. Sometimes feels like as tightness. Does not stop him from activities but can be very annoying.     Past Medical History:  Diagnosis Date  . Chronic diarrhea   . Dyslipidemia   . Elevated prostate specific antigen (PSA)   . Hearing difficulty of both ears    wears hearing aids  . History of tobacco use   . Hypertrophy of prostate without urinary obstruction and other lower urinary tract symptoms (LUTS)   . Osteoarthritis   . Prostate cancer ([place]) 11/16/2012   T1c Gleason 6 prostate cancer  . Sleep apnea    CPAP Machine   . Unspecified intestinal obstruction    Past Surgical History:  Procedure Laterality Date  . ANKLE SURGERY    . APPENDECTOMY   1981   Also had intestinal tumor removed  . COLON RESECTION  1980   fibroid  . KNEE ARTHROSCOPY W/ MENISCAL REPAIR  2012   left knee  . PROSTATE BIOPSY  11/16/2012   Social History   Social History  . Marital status: Divorced    Spouse name: N/A  . Number of children: 1  . Years of education: N/A   Occupational History  . Professor Acupuncturist   Social History Main Topics  . Smoking status: Former Smoker    Packs/day: 2.00    Years: 43.00    Types: Cigarettes    Quit date: 08/13/2008  . Smokeless tobacco: Never Used  . Alcohol use No  . Drug use: No  . Sexual activity: Not on file   Other Topics Concern  . Not on file   Social History Narrative   Daily caffeine       Work or School: business, teaches, used to [employer] Situation: lives with wife      Spiritual Beliefs: none      Lifestyle: regular exercise and healthy diet            No Known Allergies Family History  Problem Relation Age of Onset  . Irritable bowel syndrome Mother   . Arthritis    . Colon cancer Maternal  Grandfather   . Heart disease Maternal Grandfather   . Cancer Neg Hx     Past medical history, social, surgical and family history all reviewed in electronic medical record.  No pertanent information unless stated regarding to the chief complaint.   Review of Systems:Review of systems updated and as accurate as of 11/19/16  No headache, visual changes, nausea, vomiting, diarrhea, constipation, dizziness, abdominal pain, skin rash, fevers, chills, night sweats, weight loss, swollen lymph nodes, body aches, joint swelling, muscle aches, chest pain, shortness of breath, mood changes.   Objective  There were no vitals taken for this visit. Systems examined below as of 11/19/16   General: No apparent distress alert and oriented x3 mood and affect normal, dressed appropriately.  HEENT: Pupils equal, extraocular movements intact  Respiratory: Patient's speak in full  sentences and does not appear short of breath  Cardiovascular: No lower extremity edema, non tender, no erythema  Skin: Warm dry intact with no signs of infection or rash on extremities or on axial skeleton.  Abdomen: Soft nontender  Neuro: Cranial nerves II through XII are intact, neurovascularly intact in all extremities with 2+ DTRs and 2+ pulses.  Lymph: No lymphadenopathy of posterior or anterior cervical chain or axillae bilaterally.  Gait normal with good balance and coordination.  MSK:  Non tender with full range of motion and good stability and symmetric strength and tone of shoulders, elbows, wrist, hip, and ankles bilaterally.  Left hand exam shows the patient does have swelling over the first metacarpal joint. Lacks last 2 of flexion. Tender over the joint itself. Otherwise unremarkable.  Knee: Left Normal to inspection with no erythema or effusion or obvious bony abnormalities. Palpation normal with no warmth, joint line tenderness, patellar tenderness, or condyle tenderness. ROM full in flexion and extension and lower leg rotation. Ligaments with solid consistent endpoints including ACL, PCL, LCL, MCL. Negative Mcmurray's, Apley's, and Thessalonian tests. painful patellar compression. Patellar glide with mild crepitus. Patellar and quadriceps tendons unremarkable. Hamstring and quadriceps strength is normal.  Contralateral knee unremarkable  Back Exam:  Inspection: Unremarkable  Motion: Flexion 45 deg, Extension 42 deg, Side Bending to 45 deg bilaterally,  Rotation to 45 deg bilaterally  SLR laying: Negative  XSLR laying: Negative  Palpable tenderness: Tender to palpation in the paraspinal musculature. FABER: negative. Sensory change: Gross sensation intact to all lumbar and sacral dermatomes.  Reflexes: 2+ at both patellar tendons, 2+ at achilles tendons, Babinski's downgoing.  Strength at foot  Plantar-flexion: 5/5 Dorsi-flexion: 5/5 Eversion: 5/5 Inversion: 5/5    Leg strength  Quad: 5/5 Hamstring: 5/5 Hip flexor: 5/5 Hip abductors: 5/5  Gait unremarkable.    Osteopathic findings C2 flexed rotated and side bent right C4 flexed rotated and side bent left T3 extended rotated and side bent right inhaled third rib T7 extended rotated and side bent left L2 flexed rotated and side bent right Sacrum right on right  MSK US performed of: Left knee This study was ordered, performed, and interpreted by Charlann Boxer D.O.  Knee: Narrowing of the patellofemoral compartment with some mild to moderate effusion. Otherwise fairly unremarkable with post surgical changes of the medial meniscus IMPRESSION: Patellofemoral arthritis  Procedure: Real-time Ultrasound Guided Injection of left first metacarpal joint Device: GE Logiq Q7 Ultrasound guided injection is preferred based studies that show increased duration, increased effect, greater accuracy, decreased procedural pain, increased response rate, and decreased cost with ultrasound guided versus blind injection.  Verbal informed consent obtained.  Time-out conducted.  Noted no overlying erythema, induration, or other signs of local infection.  Skin prepped in a sterile fashion.  Local anesthesia: Topical Ethyl chloride.  With sterile technique and under real time ultrasound guidance:  With a 25-gauge half-inch needle patient was injected with a total of 0.5 mL of 0.5% Marcaine and 0.5 mL of Kenalog 40 mg/dL. Completed without difficulty  Pain immediately resolved suggesting accurate placement of the medication.  Advised to call if fevers/chills, erythema, induration, drainage, or persistent bleeding.  Images permanently stored and available for review in the ultrasound unit.  Impression: Technically successful ultrasound guided injection.   Impression and Recommendations:     This case required medical decision making of moderate complexity.      Note: This dictation was prepared with Dragon dictation  along with smaller phrase technology. Any transcriptional errors that result from this process are unintentional.

## 2016-11-20 ENCOUNTER — Ambulatory Visit: Payer: Self-pay

## 2016-11-20 ENCOUNTER — Other Ambulatory Visit (INDEPENDENT_AMBULATORY_CARE_PROVIDER_SITE_OTHER): Payer: Medicare HMO

## 2016-11-20 ENCOUNTER — Encounter: Payer: Self-pay | Admitting: Family Medicine

## 2016-11-20 ENCOUNTER — Ambulatory Visit (INDEPENDENT_AMBULATORY_CARE_PROVIDER_SITE_OTHER)
Admission: RE | Admit: 2016-11-20 | Discharge: 2016-11-20 | Disposition: A | Discharge status: Home / Self Care | Payer: Medicare HMO | Source: Ambulatory Visit | Attending: Family Medicine | Admitting: Family Medicine

## 2016-11-20 ENCOUNTER — Ambulatory Visit (INDEPENDENT_AMBULATORY_CARE_PROVIDER_SITE_OTHER): Payer: Medicare HMO | Admitting: Family Medicine

## 2016-11-20 VITALS — BP 138/86 | HR 78 | Resp 16 | Wt 217.5 lb

## 2016-11-20 DIAGNOSIS — M2550 Pain in unspecified joint: Secondary | ICD-10-CM | POA: Diagnosis not present

## 2016-11-20 DIAGNOSIS — M1712 Unilateral primary osteoarthritis, left knee: Secondary | ICD-10-CM | POA: Diagnosis not present

## 2016-11-20 DIAGNOSIS — G8929 Other chronic pain: Secondary | ICD-10-CM

## 2016-11-20 DIAGNOSIS — M549 Dorsalgia, unspecified: Secondary | ICD-10-CM

## 2016-11-20 DIAGNOSIS — M79642 Pain in left hand: Secondary | ICD-10-CM

## 2016-11-20 DIAGNOSIS — M545 Low back pain, unspecified: Secondary | ICD-10-CM

## 2016-11-20 DIAGNOSIS — M999 Biomechanical lesion, unspecified: Secondary | ICD-10-CM

## 2016-11-20 DIAGNOSIS — M659 Synovitis and tenosynovitis, unspecified: Secondary | ICD-10-CM | POA: Diagnosis not present

## 2016-11-20 DIAGNOSIS — M65949 Unspecified synovitis and tenosynovitis, unspecified hand: Secondary | ICD-10-CM

## 2016-11-20 LAB — SEDIMENTATION RATE: Sed Rate: 26 mm/hr — ABNORMAL HIGH (ref 0–20)

## 2016-11-20 LAB — VITAMIN D 25 HYDROXY (VIT D DEFICIENCY, FRACTURES): VITD: 17.1 ng/mL — AB (ref 30.00–100.00)

## 2016-11-20 LAB — IRON: IRON: 40 ug/dL — AB (ref 42–165)

## 2016-11-20 MED ORDER — VITAMIN D (ERGOCALCIFEROL) 1.25 MG (50000 UNIT) PO CAPS: 50000.0000 [IU] | ORAL_CAPSULE | ORAL | 0 refills | Status: DC

## 2016-11-20 NOTE — Assessment & Plan Note (Signed)
Decision today to treat with OMT was based on Physical Exam  After verbal consent patient was treated with HVLA, ME techniques in thoracic, lumbar and sacral areas  Patient tolerated the procedure well with improvement in symptoms  Patient given exercises, stretches and lifestyle modifications  See medications in patient instructions if given  Patient will follow up in 3-4 weeks  

## 2016-11-20 NOTE — Assessment & Plan Note (Signed)
Given injection today. Tolerated the procedure well. Discussed proper lifting mechanics. We will repeat if necessary.

## 2016-11-20 NOTE — Assessment & Plan Note (Signed)
Mild. Home exercises given, discussed objective is to avoid. Decline brace. Topical anti-inflammatories given. Follow-up again in 4 weeks

## 2016-11-20 NOTE — Patient Instructions (Signed)
Good to see you.  Ice 20 minutes 2 times daily. Usually after activity and before bed. For the knee do not walk on incline.  pennsaid pinkie amount topically 2 times daily as needed.  Vitamin D once weekly for 12 weeks.  We will get xray of back and the labs today  See me again in 4 weeks.

## 2016-11-20 NOTE — Assessment & Plan Note (Signed)
The patient does have low back pain. Seems to be multifactorial. We gave some strength exercises for core. We discussed icing regimen. We discussed activities to avoid. Follow-up again in 4 weeks. Responded well to manipulation.

## 2016-11-20 NOTE — Progress Notes (Signed)
Pre-visit discussion using our clinic review tool. No additional management support is needed unless otherwise documented below in the visit note.  

## 2016-11-21 NOTE — Progress Notes (Signed)
Spoke with patient per Dr. Thompson Caul recommendations. Per Dr. Tamala Julian he would like patient to take the 50,000 IU of Vitamin D he prescribed not the 5000 IU. Also gave patient iron recommendations. Patient voices understanding.

## 2016-11-28 DIAGNOSIS — C61 Malignant neoplasm of prostate: Secondary | ICD-10-CM | POA: Diagnosis not present

## 2016-11-29 DIAGNOSIS — L03032 Cellulitis of left toe: Secondary | ICD-10-CM | POA: Diagnosis not present

## 2016-11-30 DIAGNOSIS — E119 Type 2 diabetes mellitus without complications: Secondary | ICD-10-CM | POA: Diagnosis not present

## 2016-11-30 DIAGNOSIS — L03032 Cellulitis of left toe: Secondary | ICD-10-CM | POA: Diagnosis not present

## 2016-12-06 DIAGNOSIS — L03032 Cellulitis of left toe: Secondary | ICD-10-CM | POA: Diagnosis not present

## 2016-12-18 ENCOUNTER — Ambulatory Visit: Payer: Medicare HMO | Admitting: Family Medicine

## 2016-12-24 DIAGNOSIS — E7210 Disorders of sulfur-bearing amino-acid metabolism, unspecified: Secondary | ICD-10-CM | POA: Diagnosis not present

## 2016-12-24 DIAGNOSIS — R239 Unspecified skin changes: Secondary | ICD-10-CM | POA: Diagnosis not present

## 2016-12-24 DIAGNOSIS — E039 Hypothyroidism, unspecified: Secondary | ICD-10-CM | POA: Diagnosis not present

## 2016-12-24 DIAGNOSIS — E291 Testicular hypofunction: Secondary | ICD-10-CM | POA: Diagnosis not present

## 2016-12-24 DIAGNOSIS — R739 Hyperglycemia, unspecified: Secondary | ICD-10-CM | POA: Diagnosis not present

## 2016-12-24 DIAGNOSIS — E279 Disorder of adrenal gland, unspecified: Secondary | ICD-10-CM | POA: Diagnosis not present

## 2017-01-02 ENCOUNTER — Encounter: Payer: Self-pay | Admitting: Family Medicine

## 2017-01-02 ENCOUNTER — Ambulatory Visit (INDEPENDENT_AMBULATORY_CARE_PROVIDER_SITE_OTHER): Payer: Medicare HMO | Admitting: Family Medicine

## 2017-01-02 DIAGNOSIS — G8929 Other chronic pain: Secondary | ICD-10-CM

## 2017-01-02 DIAGNOSIS — M545 Low back pain, unspecified: Secondary | ICD-10-CM

## 2017-01-02 DIAGNOSIS — M65949 Unspecified synovitis and tenosynovitis, unspecified hand: Secondary | ICD-10-CM

## 2017-01-02 DIAGNOSIS — M659 Synovitis and tenosynovitis, unspecified: Secondary | ICD-10-CM | POA: Diagnosis not present

## 2017-01-02 DIAGNOSIS — M1712 Unilateral primary osteoarthritis, left knee: Secondary | ICD-10-CM

## 2017-01-02 MED ORDER — VITAMIN D (ERGOCALCIFEROL) 1.25 MG (50000 UNIT) PO CAPS: 50000.0000 [IU] | ORAL_CAPSULE | ORAL | 0 refills | Status: DC

## 2017-01-02 NOTE — Assessment & Plan Note (Signed)
Resolved after injection. Discuss that this may recur. We'll continue to monitor. Follow-up as needed

## 2017-01-02 NOTE — Assessment & Plan Note (Signed)
Stable. Decreasing effusion from previous exam. Worsening symptoms and now is consider injections, physical therapy, as well as bracing. Patient declined all those today and we'll continue with conservative therapy.

## 2017-01-02 NOTE — Progress Notes (Signed)
[name] [place], [place] [address] Phone: [phone] Subjective:    I'm seeing this patient by the request  of:    CC: back pain and hand and knee f/u   [id number]  Brett Tate. is a 71 y.o. male coming in with complaint of Back pain. Past medical history is significant for prostate cancer.Patient has known arthritic changes of back. Attempted osteopathic manipulation. Due to patient's history of prostate cancer x-rays of the back was performed 11/20/2016. Mild degenerative disc and facet arthritis but otherwise fairly unremarkable. This isn't apparently visualized by me. Patient states that overall the back seems to do better as long as he gets moving and doing the exercises.  Patient is also complaining of left index finger pain. Had arthritic changes. Was given injection 1 month ago. Patient states completely resolved after the injection.  Left knee pain. Patient was seen previously and diagnosed with more of a patellofemoral arthritis. Patient states he is been doing the home exercises intermittently. Feels that that has helped. Patient also states that the vitamins in the vitamin D supplementation has been helping. Patient is walking on a flat surface which is also not causing the anterior knee pain he was having.     Past Medical History:  Diagnosis Date  . Chronic diarrhea   . Dyslipidemia   . Elevated prostate specific antigen (PSA)   . Hearing difficulty of both ears    wears hearing aids  . History of tobacco use   . Hypertrophy of prostate without urinary obstruction and other lower urinary tract symptoms (LUTS)   . Osteoarthritis   . Prostate cancer ([place]) 11/16/2012   T1c Gleason 6 prostate cancer  . Sleep apnea    CPAP Machine   . Unspecified intestinal obstruction    Past Surgical History:  Procedure Laterality Date  . ANKLE SURGERY    . APPENDECTOMY  1981   Also had intestinal tumor removed  . COLON  RESECTION  1980   fibroid  . KNEE ARTHROSCOPY W/ MENISCAL REPAIR  2012   left knee  . PROSTATE BIOPSY  11/16/2012   Social History   Social History  . Marital status: Divorced    Spouse name: N/A  . Number of children: 1  . Years of education: N/A   Occupational History  . Professor Acupuncturist   Social History Main Topics  . Smoking status: Former Smoker    Packs/day: 2.00    Years: 43.00    Types: Cigarettes    Quit date: 08/13/2008  . Smokeless tobacco: Never Used  . Alcohol use No  . Drug use: No  . Sexual activity: Not Asked   Other Topics Concern  . 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 Irritable bowel syndrome Mother   . Arthritis Unknown   . Colon cancer Maternal Grandfather   . Heart disease Maternal Grandfather   . Cancer Neg Hx     Past medical history, social, surgical and family history all reviewed in electronic medical  record.  No pertanent information unless stated regarding to the chief complaint.   Review of Systems: No headache, visual changes, nausea, vomiting, diarrhea, constipation, dizziness, abdominal pain, skin rash, fevers, chills, night sweats, weight loss, swollen lymph nodes, body aches, joint swelling, muscle aches, chest pain, shortness of breath, mood changes.    Objective  Blood pressure 128/80, pulse 72, height 5\' 11"  (1.803 m), weight 218 lb (98.9 kg), SpO2 94 %.   Systems examined below as of 01/02/17 General: NAD A&O x3 mood, affect normal  HEENT: Pupils equal, extraocular movements intact no nystagmus Respiratory: not short of breath at rest or with speaking Cardiovascular: No lower extremity edema, non tender Skin: Warm dry intact with no  signs of infection or rash on extremities or on axial skeleton. Abdomen: Soft nontender, no masses Neuro: Cranial nerves  intact, neurovascularly intact in all extremities with 2+ DTRs and 2+ pulses. Lymph: No lymphadenopathy appreciated today  Gait normal with good balance and coordination.  MSK:  Non tender with full range of motion and good stability and symmetric strength and tone of shoulders, elbows, wrist, hip, and ankles bilaterally.  Left hand exam shows no swelling over the first metacarpal joint. Nontender.  Knee: Left Normal to inspection with no erythema or effusion or obvious bony abnormalities. Still mild pain over the patellofemoral joint ROM full in flexion and extension and lower leg rotation. Ligaments with solid consistent endpoints including ACL, PCL, LCL, MCL. Negative Mcmurray's, Apley's, and Thessalonian tests. Mild painful patellar compression. Patellar glide with mild to moderate crepitus. Patellar and quadriceps tendons unremarkable. Hamstring and quadriceps strength is normal.  Contralateral knee unremarkable  Back Exam:  Inspection: Unremarkable  Motion: Flexion 45 deg, Extension 25 deg, Side Bending to 45 deg bilaterally,  Rotation to 45 deg bilaterally  SLR laying: Negative  XSLR laying: Negative  Palpable tenderness: Tender to palpation of the Levada Dy been a musculature still moderately over the lumbosacral area on the left. FABER: negative. Sensory change: Gross sensation intact to all lumbar and sacral dermatomes.  Reflexes: 2+ at both patellar tendons, 2+ at achilles tendons, Babinski's downgoing.  Strength at foot  Plantar-flexion: 5/5 Dorsi-flexion: 5/5 Eversion: 5/5 Inversion: 5/5  Leg strength  Quad: 5/5 Hamstring: 5/5 Hip flexor: 5/5 Hip abductors: 5/5  Gait unremarkable.     Osteopathic findings C2 flexed rotated and side bent right C4 flexed rotated and side bent left T3 extended rotated and side bent right inhaled third rib T7  extended rotated and side bent left L2 flexed rotated and side bent right Sacrum right on right      Impression and Recommendations:         Note: This dictation was prepared with Dragon dictation along with smaller phrase technology. Any transcriptional errors that result from this process are unintentional.

## 2017-01-02 NOTE — Assessment & Plan Note (Signed)
Arthritic changes of the back has been noted on x-ray. Discussed with patient and continuing the core stability. Felt like he did not need osteopathic manipulation. Discussed icing regimen. No signs of any type of metastasis from prostate likely all muscle skeletal in nature. Follow-up again as needed

## 2017-02-04 ENCOUNTER — Telehealth: Payer: Self-pay | Admitting: Family Medicine

## 2017-02-04 DIAGNOSIS — Z8546 Personal history of malignant neoplasm of prostate: Secondary | ICD-10-CM

## 2017-02-04 DIAGNOSIS — E559 Vitamin D deficiency, unspecified: Secondary | ICD-10-CM

## 2017-02-04 NOTE — Telephone Encounter (Signed)
Orders placed.  lmovm for pt to return call.

## 2017-02-04 NOTE — Telephone Encounter (Signed)
I am fine please order these.

## 2017-02-04 NOTE — Telephone Encounter (Signed)
Pt called stating that he is currently on Vitamin D supplements (prescribed by Dr Tamala Julian) and is wanting to have his Vitamin D level checked to see where his levels are. He also would like to have a PSA checked due to prostate cancer. Dr Adriana Simas recommended that he check with Dr Tamala Julian and have him order them. Can he order these? Please advise.

## 2017-02-05 DIAGNOSIS — E559 Vitamin D deficiency, unspecified: Secondary | ICD-10-CM | POA: Diagnosis not present

## 2017-02-05 DIAGNOSIS — Z8546 Personal history of malignant neoplasm of prostate: Secondary | ICD-10-CM | POA: Diagnosis not present

## 2017-02-05 NOTE — Telephone Encounter (Signed)
Patient has called back.  States [facility] has not received fax script for labs.  Patient states fax number is [phone].

## 2017-02-05 NOTE — Telephone Encounter (Signed)
Was not aware that lab orders needed to be faxed.  Faxed orders to place requested.

## 2017-02-11 ENCOUNTER — Encounter: Payer: Self-pay | Admitting: Family Medicine

## 2017-03-16 ENCOUNTER — Encounter: Payer: Self-pay | Admitting: Emergency Medicine

## 2017-03-16 ENCOUNTER — Emergency Department
Admission: EM | Admit: 2017-03-16 | Discharge: 2017-03-16 | Disposition: A | Discharge status: Home / Self Care | Payer: Medicare HMO | Source: Home / Self Care | Attending: Family Medicine | Admitting: Family Medicine

## 2017-03-16 DIAGNOSIS — L03011 Cellulitis of right finger: Secondary | ICD-10-CM

## 2017-03-16 MED ORDER — DOXYCYCLINE HYCLATE 100 MG PO CAPS: 100.0000 mg | ORAL_CAPSULE | Freq: Two times a day (BID) | ORAL | 0 refills | Status: DC

## 2017-03-16 MED ORDER — MUPIROCIN 2 % EX OINT: 1.0000 "application " | TOPICAL_OINTMENT | Freq: Three times a day (TID) | CUTANEOUS | 0 refills | Status: DC

## 2017-03-16 NOTE — ED Triage Notes (Signed)
Pt c/o right index finger pain and swelling around nailbed. Started about 3 days ago getting worse. States last tetanus was 5 years ago.

## 2017-03-16 NOTE — ED Provider Notes (Signed)
Brett Tate CARE    CSN: [id number] Arrival date & time: 03/16/17  0921     History   Chief Complaint Chief Complaint  Patient presents with  . Finger Injury    HPI Brett Tate. is a 71 y.o. male.   Patient complains of pain and swelling at the edge of his right fingernail for about 3 days.  Last Tdap was 5 years ago.   The history is provided by the patient.  Hand Pain  This is a new problem. Episode onset: 3 days ago. The problem occurs constantly. The problem has been gradually worsening. Associated symptoms comments: none. Exacerbated by: palpation. Nothing relieves the symptoms. He has tried nothing for the symptoms.    Past Medical History:  Diagnosis Date  . Chronic diarrhea   . Dyslipidemia   . Elevated prostate specific antigen (PSA)   . Hearing difficulty of both ears    wears hearing aids  . History of tobacco use   . Hypertrophy of prostate without urinary obstruction and other lower urinary tract symptoms (LUTS)   . Osteoarthritis   . Prostate cancer ([place]) 11/16/2012   T1c Gleason 6 prostate cancer  . Sleep apnea    CPAP Machine   . Unspecified intestinal obstruction     Patient Active Problem List   Diagnosis Date Noted  . Low back pain 11/20/2016  . Synovitis of finger 11/20/2016  . Patellofemoral arthritis of left knee 11/20/2016  . Nonallopathic lesion of thoracic region 11/20/2016  . Nonallopathic lesion of lumbosacral region 11/20/2016  . Nonallopathic lesion of sacral region 11/20/2016  . Perianal irritation 07/04/2016  . Family hx of colon cancer 07/04/2016  . Diarrhea 07/04/2016  . IBS (irritable bowel syndrome) 06/28/2016  . Obstructive sleep apnea 06/28/2016  . Essential hypertension 06/28/2016  . BMI 31.0-31.9,adult 06/28/2016  . Malignant neoplasm of prostate ([place]) 05/20/2013  . Hand arthritis 09/03/2012  . Allergic dermatitis 08/20/2012  . Dyslipidemia 08/20/2012    Past Surgical History:  Procedure Laterality  Date  . ANKLE SURGERY    . APPENDECTOMY  1981   Also had intestinal tumor removed  . COLON RESECTION  1980   fibroid  . KNEE ARTHROSCOPY W/ MENISCAL REPAIR  2012   left knee  . PROSTATE BIOPSY  11/16/2012       Home Medications    Prior to Admission medications   Medication Sig Start Date End Date Taking? Authorizing Provider  modafinil (PROVIGIL) 100 MG tablet Take 100 mg by mouth daily.   Yes [provider]  AMBULATORY NON FORMULARY MEDICATION CPAP MACHINE    [provider]  dicyclomine (BENTYL) 10 MG capsule TAKE 1 CAPSULE BY MOUTH 3 TIMES DAILY AS NEEDED 06/28/16   Lucretia Kern, DO  doxycycline (VIBRAMYCIN) 100 MG capsule Take 1 capsule (100 mg total) by mouth 2 (two) times daily. Take with food. 03/16/17   Kandra Nicolas, MD  mupirocin ointment (BACTROBAN) 2 % Apply 1 application topically 3 (three) times daily. 03/16/17   Kandra Nicolas, MD  Vitamin D, Ergocalciferol, (DRISDOL) 50000 units CAPS capsule Take 1 capsule (50,000 Units total) by mouth every 7 (seven) days. 01/02/17   Lyndal Pulley, DO    Family History Family History  Problem Relation Age of Onset  . Irritable bowel syndrome Mother   . Arthritis Unknown   . Colon cancer Maternal Grandfather   . Heart disease Maternal Grandfather   . Cancer Neg Hx     Social  History Social History  Substance Use Topics  . Smoking status: Former Smoker    Packs/day: 2.00    Years: 43.00    Types: Cigarettes    Quit date: 08/13/2008  . Smokeless tobacco: Never Used  . Alcohol use No     Allergies   Patient has no known allergies.   Review of Systems Review of Systems  All other systems reviewed and are negative.    Physical Exam Triage Vital Signs ED Triage Vitals  Enc Vitals Group     BP 03/16/17 0949 (!) 159/96     Pulse --      Resp --      Temp 03/16/17 0949 97.9 F (36.6 C)     Temp Source 03/16/17 0949 Oral     SpO2 03/16/17 0949 96 %     Weight 03/16/17 0950 220 lb (99.8  kg)     Height --      Head Circumference --      Peak Flow --      Pain Score 03/16/17 0950 3     Pain Loc --      Pain Edu? --      Excl. in [place]? --    No data found.   Updated Vital Signs BP (!) 159/96 (BP Location: Right Arm)   Temp 97.9 F (36.6 C) (Oral)   Wt 220 lb (99.8 kg)   SpO2 96%   BMI 30.68 kg/m   Visual Acuity Right Eye Distance:   Left Eye Distance:   Bilateral Distance:    Right Eye Near:   Left Eye Near:    Bilateral Near:     Physical Exam  Constitutional: He appears well-developed and well-nourished. No distress.  HENT:  Head: Normocephalic.  Eyes: Pupils are equal, round, and reactive to light.  Cardiovascular: Normal rate.   Pulmonary/Chest: Effort normal.  Musculoskeletal:       Hands: Distal phalanx of right 4th finger has pustule on erythematous base at edge of fingernail as noted on diagram.  No tenderness/swelling of DIP joint.  Neurological: He is alert.  Skin: Skin is warm and dry.  Nursing note and vitals reviewed.    UC Treatments / Results  Labs (all labs ordered are listed, but only abnormal results are displayed) Labs Reviewed  WOUND CULTURE    EKG  EKG Interpretation None       Radiology No results found.  Procedures Procedures   Incise and drain paronychia: Risks and benefits of procedure explained to patient and verbal consent obtained.  Using sterile technique, cleansed affected area with Betadine and alcohol. Using topical refrigerant anesthesia, Identified the most fluctuant area at edge of right fourth fingernail and gently opened with 18 gage needle:  expressed purulent material and culture specimen obtained.  Applied Bacitracin ointment and bandage.  Patient tolerated well    Medications Ordered in UC Medications - No data to display   Initial Impression / Assessment and Plan / UC Course  I have reviewed the triage vital signs and the nursing notes.  Pertinent labs & imaging results that were  available during my care of the patient were reviewed by me and considered in my medical decision making (see chart for details).    Wound culture pending. Begin Bactroban ointment and oral doxycycline 100mg  BID. Change dressing daily and apply Bactroban ointment to wound.  Keep wound clean and dry.  Continue warm soaks two or three times daily until healed.   Return for any  signs of increasing infection (or follow-up with family doctor):  Increasing redness, swelling, pain, heat, drainage, etc. Followup with Family Doctor if not improved in about 4 days.    Final Clinical Impressions(s) / UC Diagnoses   Final diagnoses:  Paronychia, finger, right    New Prescriptions New Prescriptions   DOXYCYCLINE (VIBRAMYCIN) 100 MG CAPSULE    Take 1 capsule (100 mg total) by mouth 2 (two) times daily. Take with food.   MUPIROCIN OINTMENT (BACTROBAN) 2 %    Apply 1 application topically 3 (three) times daily.     Kandra Nicolas, MD 03/29/17 [phone]

## 2017-03-16 NOTE — Discharge Instructions (Signed)
Change dressing daily and apply Bactroban ointment to wound.  Keep wound clean and dry.  Continue warm soaks two or three times daily until healed.   Return for any signs of increasing infection (or follow-up with family doctor):  Increasing redness, swelling, pain, heat, drainage, etc.

## 2017-03-18 ENCOUNTER — Telehealth: Payer: Self-pay | Admitting: *Deleted

## 2017-03-18 NOTE — Telephone Encounter (Signed)
Callback: Confirmed patient was able to pickup RX at [facility]. He reports his finger is improving.

## 2017-03-19 ENCOUNTER — Telehealth: Payer: Self-pay | Admitting: *Deleted

## 2017-03-19 DIAGNOSIS — R972 Elevated prostate specific antigen [PSA]: Secondary | ICD-10-CM | POA: Diagnosis not present

## 2017-03-19 LAB — WOUND CULTURE: ORGANISM ID, BACTERIA: NORMAL

## 2017-03-19 NOTE — Telephone Encounter (Signed)
LM to call back for Wcx results. Penny Frisbie, LPN  

## 2017-03-20 NOTE — Telephone Encounter (Signed)
Pt called back given results.

## 2017-03-20 NOTE — Telephone Encounter (Signed)
Left message to call for results

## 2017-03-27 DIAGNOSIS — R972 Elevated prostate specific antigen [PSA]: Secondary | ICD-10-CM | POA: Diagnosis not present

## 2017-03-27 DIAGNOSIS — C61 Malignant neoplasm of prostate: Secondary | ICD-10-CM | POA: Diagnosis not present

## 2017-05-02 ENCOUNTER — Encounter: Payer: Self-pay | Admitting: Family Medicine

## 2017-07-16 DIAGNOSIS — R0981 Nasal congestion: Secondary | ICD-10-CM | POA: Diagnosis not present

## 2017-07-16 DIAGNOSIS — Z0000 Encounter for general adult medical examination without abnormal findings: Secondary | ICD-10-CM | POA: Diagnosis not present

## 2017-07-16 DIAGNOSIS — Z87891 Personal history of nicotine dependence: Secondary | ICD-10-CM | POA: Diagnosis not present

## 2017-07-16 DIAGNOSIS — K589 Irritable bowel syndrome without diarrhea: Secondary | ICD-10-CM | POA: Diagnosis not present

## 2017-07-16 DIAGNOSIS — Z6833 Body mass index (BMI) 33.0-33.9, adult: Secondary | ICD-10-CM | POA: Diagnosis not present

## 2017-07-16 DIAGNOSIS — Z9049 Acquired absence of other specified parts of digestive tract: Secondary | ICD-10-CM | POA: Diagnosis not present

## 2017-07-16 DIAGNOSIS — R030 Elevated blood-pressure reading, without diagnosis of hypertension: Secondary | ICD-10-CM | POA: Diagnosis not present

## 2017-07-16 DIAGNOSIS — E669 Obesity, unspecified: Secondary | ICD-10-CM | POA: Diagnosis not present

## 2017-08-22 ENCOUNTER — Encounter: Payer: Self-pay | Admitting: Family Medicine

## 2017-09-10 DIAGNOSIS — H353131 Nonexudative age-related macular degeneration, bilateral, early dry stage: Secondary | ICD-10-CM | POA: Diagnosis not present

## 2017-09-10 DIAGNOSIS — H52223 Regular astigmatism, bilateral: Secondary | ICD-10-CM | POA: Diagnosis not present

## 2017-10-23 ENCOUNTER — Telehealth: Payer: Self-pay | Admitting: Family Medicine

## 2017-10-23 NOTE — Telephone Encounter (Signed)
Copied from [place] [id number]. Topic: Referral - Request >> Oct 23, 2017  1:46 PM Hewitt Shorts wrote: Reason for CRM: pt is requesting a ent referral please best number [phone]

## 2017-10-23 NOTE — Telephone Encounter (Signed)
Okay to refer? 

## 2017-10-24 DIAGNOSIS — G4733 Obstructive sleep apnea (adult) (pediatric): Secondary | ICD-10-CM | POA: Diagnosis not present

## 2017-10-24 DIAGNOSIS — R05 Cough: Secondary | ICD-10-CM | POA: Diagnosis not present

## 2017-10-24 DIAGNOSIS — Z9989 Dependence on other enabling machines and devices: Secondary | ICD-10-CM | POA: Diagnosis not present

## 2017-10-24 DIAGNOSIS — J988 Other specified respiratory disorders: Secondary | ICD-10-CM | POA: Diagnosis not present

## 2017-10-24 DIAGNOSIS — K219 Gastro-esophageal reflux disease without esophagitis: Secondary | ICD-10-CM | POA: Diagnosis not present

## 2017-10-24 DIAGNOSIS — J342 Deviated nasal septum: Secondary | ICD-10-CM | POA: Diagnosis not present

## 2017-10-24 DIAGNOSIS — R69 Illness, unspecified: Secondary | ICD-10-CM | POA: Diagnosis not present

## 2017-10-24 DIAGNOSIS — R490 Dysphonia: Secondary | ICD-10-CM | POA: Diagnosis not present

## 2017-10-24 NOTE — Telephone Encounter (Signed)
Please let him know, a referral was not usually needed.  Also, is this something we could help him with?  Please provide him with the numbers for [facility] ear nose and throat and Dr. Pollie Friar office.  Thank you.

## 2017-10-24 NOTE — Telephone Encounter (Signed)
I called the pt and left a detailed message with the information below and both numbers for [facility] and Dr Pollie Friar office.

## 2017-11-13 DIAGNOSIS — L600 Ingrowing nail: Secondary | ICD-10-CM | POA: Diagnosis not present

## 2017-11-13 DIAGNOSIS — M25774 Osteophyte, right foot: Secondary | ICD-10-CM | POA: Diagnosis not present

## 2017-11-22 DIAGNOSIS — L600 Ingrowing nail: Secondary | ICD-10-CM | POA: Diagnosis not present

## 2018-03-17 DIAGNOSIS — K219 Gastro-esophageal reflux disease without esophagitis: Secondary | ICD-10-CM | POA: Diagnosis not present

## 2018-03-17 DIAGNOSIS — R490 Dysphonia: Secondary | ICD-10-CM | POA: Diagnosis not present

## 2018-03-17 DIAGNOSIS — G4733 Obstructive sleep apnea (adult) (pediatric): Secondary | ICD-10-CM | POA: Diagnosis not present

## 2018-04-11 ENCOUNTER — Encounter: Payer: Self-pay | Admitting: Adult Health

## 2018-04-11 ENCOUNTER — Ambulatory Visit: Payer: Self-pay | Admitting: Family Medicine

## 2018-04-11 ENCOUNTER — Ambulatory Visit (INDEPENDENT_AMBULATORY_CARE_PROVIDER_SITE_OTHER): Payer: Medicare HMO

## 2018-04-11 ENCOUNTER — Ambulatory Visit (INDEPENDENT_AMBULATORY_CARE_PROVIDER_SITE_OTHER): Payer: Medicare HMO | Admitting: Adult Health

## 2018-04-11 VITALS — BP 158/86 | HR 87 | Temp 98.4°F | Wt 219.0 lb

## 2018-04-11 DIAGNOSIS — R05 Cough: Secondary | ICD-10-CM | POA: Diagnosis not present

## 2018-04-11 DIAGNOSIS — J069 Acute upper respiratory infection, unspecified: Secondary | ICD-10-CM

## 2018-04-11 MED ORDER — IPRATROPIUM-ALBUTEROL 0.5-2.5 (3) MG/3ML IN SOLN
3.0000 mL | Freq: Once | RESPIRATORY_TRACT | Status: AC
  Administered 2018-04-11: 3 mL via RESPIRATORY_TRACT

## 2018-04-11 MED ORDER — LEVOFLOXACIN 750 MG PO TABS: 750.0000 mg | ORAL_TABLET | Freq: Every day | ORAL | 0 refills | Status: DC

## 2018-04-11 MED ORDER — PREDNISONE 10 MG PO TABS: ORAL_TABLET | ORAL | 0 refills | Status: DC

## 2018-04-11 NOTE — Progress Notes (Signed)
Subjective:    Patient ID: Brett Tate., male    DOB: Oct 18, 1945, 72 y.o.   MRN: [id number]  Non smoker  He has treated himself at home with a course of doxycyline about a week ago. He noticed very little improvement   URI   This is a new problem. The current episode started more than 1 month ago (2-3 months). The problem has been gradually worsening. There has been no fever. Associated symptoms include congestion, coughing (" glue"), a sore throat and wheezing. Pertinent negatives include no ear pain, plugged ear sensation, rhinorrhea or sinus pain.    Review of Systems  Constitutional: Negative.   HENT: Positive for congestion, sore throat, trouble swallowing and voice change. Negative for ear pain, rhinorrhea and sinus pain.   Respiratory: Positive for cough (" glue"), chest tightness, shortness of breath and wheezing.   Cardiovascular: Negative.   Musculoskeletal: Negative.    Past Medical History:  Diagnosis Date  . Chronic diarrhea   . Dyslipidemia   . Elevated prostate specific antigen (PSA)   . Hearing difficulty of both ears    wears hearing aids  . History of tobacco use   . Hypertrophy of prostate without urinary obstruction and other lower urinary tract symptoms (LUTS)   . Osteoarthritis   . Prostate cancer ([place]) 11/16/2012   T1c Gleason 6 prostate cancer  . Sleep apnea    CPAP Machine   . Unspecified intestinal obstruction     Social History   Socioeconomic History  . Marital status: Divorced    Spouse name: Not on file  . Number of children: 1  . Years of education: Not on file  . Highest education level: Not on file  Occupational History  . Occupation: Professor    Fish farm manager: [employer]    Comment: Nurse, learning disability  Social Needs  . Financial resource strain: Not on file  . Food insecurity:    Worry: Not on file    Inability: Not on file  . Transportation needs:    Medical: Not on file    Non-medical: Not on file  Tobacco Use  . Smoking  status: Former Smoker    Packs/day: 2.00    Years: 43.00    Pack years: 86.00    Types: Cigarettes    Last attempt to quit: 08/13/2008    Years since quitting: 9.6  . Smokeless tobacco: Never Used  Substance and Sexual Activity  . Alcohol use: No  . Drug use: No  . Sexual activity: Not on file  Lifestyle  . Physical activity:    Days per week: Not on file    Minutes per session: Not on file  . Stress: Not on file  Relationships  . Social connections:    Talks on phone: Not on file    Gets together: Not on file    Attends religious service: Not on file    Active member of club or organization: Not on file    Attends meetings of clubs or organizations: Not on file    Relationship status: Not on file  . Intimate partner violence:    Fear of current or ex partner: Not on file    Emotionally abused: Not on file    Physically abused: Not on file    Forced sexual activity: Not on file  Other Topics Concern  . Not on file  Social History Narrative   Daily caffeine       Work or School: business, teaches,  used to do art      Home Situation: lives with wife      Spiritual Beliefs: none      Lifestyle: regular exercise and healthy diet             Past Surgical History:  Procedure Laterality Date  . ANKLE SURGERY    . APPENDECTOMY  1981   Also had intestinal tumor removed  . COLON RESECTION  1980   fibroid  . KNEE ARTHROSCOPY W/ MENISCAL REPAIR  2012   left knee  . PROSTATE BIOPSY  11/16/2012    Family History  Problem Relation Age of Onset  . Irritable bowel syndrome Mother   . Arthritis Unknown   . Colon cancer Maternal Grandfather   . Heart disease Maternal Grandfather   . Cancer Neg Hx     No Known Allergies  Current Outpatient Medications on File Prior to Visit  Medication Sig Dispense Refill  . AMBULATORY NON FORMULARY MEDICATION CPAP MACHINE    . dicyclomine (BENTYL) 10 MG capsule TAKE 1 CAPSULE BY MOUTH 3 TIMES DAILY AS NEEDED 90 capsule 2  .  doxycycline (VIBRAMYCIN) 100 MG capsule Take 1 capsule (100 mg total) by mouth 2 (two) times daily. Take with food. 14 capsule 0  . modafinil (PROVIGIL) 100 MG tablet Take 100 mg by mouth daily.    . mupirocin ointment (BACTROBAN) 2 % Apply 1 application topically 3 (three) times daily. 15 g 0  . omeprazole (PRILOSEC) 40 MG capsule Take 1 capsule by mouth 2 (two) times daily.    . Vitamin D, Ergocalciferol, (DRISDOL) 50000 units CAPS capsule Take 1 capsule (50,000 Units total) by mouth every 7 (seven) days. 12 capsule 0   No current facility-administered medications on file prior to visit.     BP (!) 158/86   Pulse 87   Temp 98.4 F (36.9 C) (Oral)   Wt 219 lb (99.3 kg)   SpO2 96%   BMI 30.54 kg/m       Objective:   Physical Exam  Constitutional: He appears well-developed and well-nourished. No distress.  Cardiovascular: Normal rate, regular rhythm, normal heart sounds and intact distal pulses.  Pulmonary/Chest: Effort normal. He has wheezes in the right upper field, the right middle field, the right lower field, the left upper field, the left middle field and the left lower field. He has rhonchi in the right upper field, the right middle field, the right lower field, the left upper field, the left middle field and the left lower field.  Skin: He is not diaphoretic.  Nursing note and vitals reviewed.     Assessment & Plan:  1. Upper respiratory tract infection, unspecified type - Will get chest xray but it is late in the day and will not get results prior to labor day weekend. Will treat this as pneumonia. Advised to go to the ER if symptoms worsen over the weekend.  ipratropium-albuterol (DUONEB) 0.5-2.5 (3) MG/3ML nebulizer solution 3 mL - DG Chest 2 View; Future - predniSONE (DELTASONE) 10 MG tablet; 40 mg x 3 days, 20 mg x 3 days, 10 mg x 3 days  Dispense: 21 tablet; Refill: 0 - levofloxacin (LEVAQUIN) 750 MG tablet; Take 1 tablet (750 mg total) by mouth daily.  Dispense: 7  tablet; Refill: 0  Wheezing and rhonchi had improved after duo neb but were still present throughout.Patient endorses feeling improved.   Dorothyann Peng, NP

## 2018-04-11 NOTE — Telephone Encounter (Signed)
Called in c/o having bad coughing spells and he can't stop coughing.   "I'm coughing up thick green stuff that looks like glue".   This has been going on for about a week, I guess.   His voice is horse.     At one point he mentioned  His fingers were blue.   When I questioned him further about his breathing he said,  "I've been fighting this a while".   I suggested he may need to go to the ED if his fingertips are blue.    "They are not really blue but just a different color".   "They don't hurt or anything".    He adamantly refused to go to the ED.    After talking with him I was comfortable with making him an appt with the understanding if he symptoms and breathing became worse he could go to the ED.  I scheduled him with Dorothyann Peng, AGNP-C for today at 3:30.  Dr. Maudie Mercury was booked.   Reason for Disposition . [1] Continuous (nonstop) coughing interferes with work or school AND [2] no improvement using cough treatment per Care Advice  Answer Assessment - Initial Assessment Questions 1. ONSET: "When did the cough begin?"      A week ago.   Coughing up green stuff that is thick like glue.    Voice is horse.   Throat is sore.  Using salt water rinses.   2. SEVERITY: "How bad is the cough today?"      Coughing up green stuff 3. RESPIRATORY DISTRESS: "Describe your breathing."      When I walk the dog I'm short of breath.    My fingers are blue.    4. FEVER: "Do you have a fever?" If so, ask: "What is your temperature, how was it measured, and when did it start?"     No 5. SPUTUM: "Describe the color of your sputum" (clear, white, yellow, green)     green 6. HEMOPTYSIS: "Are you coughing up any blood?" If so ask: "How much?" (flecks, streaks, tablespoons, etc.)     No blood 7. CARDIAC HISTORY: "Do you have any history of heart disease?" (e.g., heart attack, congestive heart failure)      No 8. LUNG HISTORY: "Do you have any history of lung disease?"  (e.g., pulmonary embolus, asthma, emphysema)     No. 9. PE RISK FACTORS: "Do you have a history of blood clots?" (or: recent major surgery, recent prolonged travel, bedridden)     No 10. OTHER SYMPTOMS: "Do you have any other symptoms?" (e.g., runny nose, wheezing, chest pain)       Wheezing is audible 11. PREGNANCY: "Is there any chance you are pregnant?" "When was your last menstrual period?"       N/A 12. TRAVEL: "Have you traveled out of the country in the last month?" (e.g., travel history, exposures)       Not asked  Protocols used: [place]

## 2018-04-17 ENCOUNTER — Encounter: Payer: Self-pay | Admitting: Family Medicine

## 2018-04-17 ENCOUNTER — Ambulatory Visit (INDEPENDENT_AMBULATORY_CARE_PROVIDER_SITE_OTHER): Payer: Medicare HMO | Admitting: Family Medicine

## 2018-04-17 VITALS — BP 170/90 | HR 81 | Temp 98.0°F | Ht 71.0 in | Wt 220.3 lb

## 2018-04-17 DIAGNOSIS — K219 Gastro-esophageal reflux disease without esophagitis: Secondary | ICD-10-CM | POA: Diagnosis not present

## 2018-04-17 DIAGNOSIS — I10 Essential (primary) hypertension: Secondary | ICD-10-CM

## 2018-04-17 DIAGNOSIS — R05 Cough: Secondary | ICD-10-CM | POA: Diagnosis not present

## 2018-04-17 DIAGNOSIS — R0600 Dyspnea, unspecified: Secondary | ICD-10-CM | POA: Diagnosis not present

## 2018-04-17 DIAGNOSIS — R059 Cough, unspecified: Secondary | ICD-10-CM

## 2018-04-17 DIAGNOSIS — R9389 Abnormal findings on diagnostic imaging of other specified body structures: Secondary | ICD-10-CM | POA: Diagnosis not present

## 2018-04-17 LAB — BASIC METABOLIC PANEL
BUN: 10 mg/dL (ref 6–23)
CHLORIDE: 100 meq/L (ref 96–112)
CO2: 32 mEq/L (ref 19–32)
Calcium: 8.8 mg/dL (ref 8.4–10.5)
Creatinine, Ser: 0.81 mg/dL (ref 0.40–1.50)
GFR: 99.79 mL/min (ref >60.00)
GLUCOSE: 116 mg/dL — AB (ref 70–99)
POTASSIUM: 3 meq/L — AB (ref 3.5–5.1)
Sodium: 140 mEq/L (ref 135–145)

## 2018-04-17 LAB — CBC
HEMATOCRIT: 42.5 % (ref 39.0–52.0)
HEMOGLOBIN: 14.4 g/dL (ref 13.0–17.0)
MCHC: 33.8 g/dL (ref 30.0–36.0)
MCV: 86.6 fl (ref 78.0–100.0)
PLATELETS: 331 10*3/uL (ref 150.0–400.0)
RBC: 4.91 Mil/uL (ref 4.22–5.81)
RDW: 13.7 % (ref 11.5–15.5)
WBC: 11.8 10*3/uL — ABNORMAL HIGH (ref 4.0–10.5)

## 2018-04-17 MED ORDER — HYDROCHLOROTHIAZIDE 25 MG PO TABS: 25.0000 mg | ORAL_TABLET | Freq: Every day | ORAL | 3 refills | Status: DC

## 2018-04-17 MED ORDER — FLUTICASONE PROPIONATE 50 MCG/ACT NA SUSP: 2.0000 | Freq: Every day | NASAL | 6 refills | Status: DC

## 2018-04-17 MED ORDER — ESOMEPRAZOLE MAGNESIUM 40 MG PO CPDR: 40.0000 mg | DELAYED_RELEASE_CAPSULE | Freq: Every day | ORAL | 3 refills | Status: DC

## 2018-04-17 MED ORDER — ALBUTEROL SULFATE HFA 108 (90 BASE) MCG/ACT IN AERS: 2.0000 | INHALATION_SPRAY | Freq: Four times a day (QID) | RESPIRATORY_TRACT | 0 refills | Status: DC | PRN

## 2018-04-17 NOTE — Patient Instructions (Signed)
BEFORE YOU LEAVE: -follow up: 1 month  I sent a referral about the cough and lung issues. Please let us know if someone does not call you in the next 5-7 days about this appointment.  STOP the prilosec and take nexium 40mg  once daily for 2 weeks, then 20mg  once dialy.  Flonase 2 sprays each nostril daily for 1 month.  Albuterol as needed for wheezing.  Start Hydrochlorothiazide 25 mg once daily in the morning for blood pressure.

## 2018-04-17 NOTE — Progress Notes (Signed)
HPI:  Using dictation device. Unfortunately this device frequently misinterprets words/phrases.  Follow up resp illness: -reports ongoing resp issues for at least several months -symptoms include hoarseness, cough productive of green mucus, some SOB, wheezing -seen 8/30 and treated w/ prednisone, levaquine, duonebs for upper and lower resp symptoms - he thinks helped temporarily, but not much -xray 8/30 per radiology summary showed "Coarse interstitial opacities may represent edema, atypical infection or chronic interstitial process." -saw Dr. Wilburn Cornelia, ent 8/5 and per notes had findings of laryngoscopy c/w GERD - he was to take twice daily ppi and follow up in 6 weeks -reports taking the PPI and also took a course of doxycycline prior to the levaquin -also taking zyrtec -has hx of AR, GERD and smoked 45 years - quit 10 yrs ago, hx OSA on CPAP -denies vaping or e-cigs, travel, fever, wt loss, hemoptysis, rashes -feels inhalers have helped in the past -not taking flonase -elevated BP today, looks like has been up at several appts in the past as well, denies CP, palpitations, swelling  ROS: See pertinent positives and negatives per HPI.  Past Medical History:  Diagnosis Date  . Chronic diarrhea   . Dyslipidemia   . Elevated prostate specific antigen (PSA)   . Hearing difficulty of both ears    wears hearing aids  . History of tobacco use   . Hypertrophy of prostate without urinary obstruction and other lower urinary tract symptoms (LUTS)   . Osteoarthritis   . Prostate cancer ([place]) 11/16/2012   T1c Gleason 6 prostate cancer  . Sleep apnea    CPAP Machine   . Unspecified intestinal obstruction     Past Surgical History:  Procedure Laterality Date  . ANKLE SURGERY    . APPENDECTOMY  1981   Also had intestinal tumor removed  . COLON RESECTION  1980   fibroid  . KNEE ARTHROSCOPY W/ MENISCAL REPAIR  2012   left knee  . PROSTATE BIOPSY  11/16/2012    Family History  Problem  Relation Age of Onset  . Irritable bowel syndrome Mother   . Arthritis Unknown   . Colon cancer Maternal Grandfather   . Heart disease Maternal Grandfather   . Cancer Neg Hx     SOCIAL HX: see hpi   Current Outpatient Medications:  .  AMBULATORY NON FORMULARY MEDICATION, CPAP MACHINE, Disp: , Rfl:  .  dicyclomine (BENTYL) 10 MG capsule, TAKE 1 CAPSULE BY MOUTH 3 TIMES DAILY AS NEEDED, Disp: 90 capsule, Rfl: 2 .  modafinil (PROVIGIL) 100 MG tablet, Take 100 mg by mouth daily., Disp: , Rfl:  .  mupirocin ointment (BACTROBAN) 2 %, Apply 1 application topically 3 (three) times daily., Disp: 15 g, Rfl: 0 .  predniSONE (DELTASONE) 10 MG tablet, 40 mg x 3 days, 20 mg x 3 days, 10 mg x 3 days, Disp: 21 tablet, Rfl: 0 .  Vitamin D, Ergocalciferol, (DRISDOL) 50000 units CAPS capsule, Take 1 capsule (50,000 Units total) by mouth every 7 (seven) days., Disp: 12 capsule, Rfl: 0 .  albuterol (PROVENTIL HFA;VENTOLIN HFA) 108 (90 Base) MCG/ACT inhaler, Inhale 2 puffs into the lungs every 6 (six) hours as needed., Disp: 1 Inhaler, Rfl: 0 .  esomeprazole (NEXIUM) 40 MG capsule, Take 1 capsule (40 mg total) by mouth daily., Disp: 30 capsule, Rfl: 3 .  fluticasone (FLONASE) 50 MCG/ACT nasal spray, Place 2 sprays into both nostrils daily., Disp: 16 g, Rfl: 6 .  hydrochlorothiazide (HYDRODIURIL) 25 MG tablet, Take 1 tablet (25  mg total) by mouth daily., Disp: 90 tablet, Rfl: 3  EXAM:  Vitals:   04/17/18 1106 04/17/18 1108  BP: (!) 150/98 (!) 170/90  Pulse: 81   Temp: 98 F (36.7 C)   SpO2: 96%     Body mass index is 30.73 kg/m.  GENERAL: vitals reviewed and listed above, alert, oriented, appears well hydrated and in no acute distress  HEENT: atraumatic, conjunttiva clear, no obvious abnormalities on inspection of external nose and ears, hearing aides, thick nasal congestion, mild post oropharyngeal erythema with PND, no tonsillar edema or exudate, no sinus TTP  NECK: no obvious masses on  inspection  LUNGS: scattered exp wheeze  CV: HRRR, no peripheral edema  MS: moves all extremities without noticeable abnormality  PSYCH: pleasant and cooperative, no obvious depression or anxiety  ASSESSMENT AND PLAN:  Discussed the following assessment and plan:  Dyspnea, unspecified type  Cough  Hoaresness Abnormal CXR  -we discussed possible serious and likely etiologies, workup and treatment, treatment risks and return precautions -after this discussion, Brett Tate opted for referral to pulm (urgent), closer follow up with ENT, change to nexium in interim, add flonase, alb prn -follow up advised here in 1 month -of course, we advised [place]  to return or notify a doctor immediately if symptoms worsen or persist or new concerns arise.  Gastroesophageal reflux disease, esophagitis presence not specified -see above, nexium  Essential hypertension - Plan: hydrochlorothiazide (HYDRODIURIL) 25 MG tablet, Basic metabolic panel, CBC -elevated in the past for some time with other providers per brief review of chart -discussed options and decided to start diuretic -follow up 1 month  -Patient advised to return or notify a doctor immediately if symptoms worsen or persist or new concerns arise.  Patient Instructions  BEFORE YOU LEAVE: -follow up: 1 month  I sent a referral about the cough and lung issues. Please let us know if someone does not call you in the next 5-7 days about this appointment.  STOP the prilosec and take nexium 40mg  once daily for 2 weeks, then 20mg  once dialy.  Flonase 2 sprays each nostril daily for 1 month.  Albuterol as needed for wheezing.  Start Hydrochlorothiazide 25 mg once daily in the morning for blood pressure.    Lucretia Kern, DO

## 2018-04-18 MED ORDER — LOSARTAN POTASSIUM 50 MG PO TABS: 50.0000 mg | ORAL_TABLET | Freq: Every day | ORAL | 1 refills | Status: DC

## 2018-04-18 NOTE — Addendum Note (Signed)
Addended by: Agnes Lawrence on: 04/18/2018 03:33 PM   Modules accepted: Orders

## 2018-04-22 ENCOUNTER — Other Ambulatory Visit: Payer: Medicare HMO

## 2018-04-22 ENCOUNTER — Encounter: Payer: Self-pay | Admitting: Pulmonary Disease

## 2018-04-22 ENCOUNTER — Ambulatory Visit: Payer: Medicare HMO | Admitting: Pulmonary Disease

## 2018-04-22 DIAGNOSIS — J849 Interstitial pulmonary disease, unspecified: Secondary | ICD-10-CM

## 2018-04-22 DIAGNOSIS — R0602 Shortness of breath: Secondary | ICD-10-CM | POA: Diagnosis not present

## 2018-04-22 MED ORDER — PREDNISONE 10 MG PO TABS: ORAL_TABLET | ORAL | 0 refills | Status: DC

## 2018-04-22 NOTE — Progress Notes (Signed)
Brett Tate    [id number]    [birth year]  Primary Care Physician:Kim, Nickola Major, DO  Referring Physician: Lucretia Kern, DO [address] [place], [place] [address]  Chief complaint:   Persistent cough and shortness of breath  HPI:  Persistent bronchitis Multiple antibiotics recently Levaquin seem to help with additional steroids  Reformed smoker Was never diagnosed with any lung disease while he was smoking and soon after  Cough, shortness of breath, thick green mucus production  Pets: dog Occupation: Office work Exposures: No significant exposure to [employer] does wear his CPAP on a regular basis and is unsure-cleans it regularly Smoking history: Quit smoking about 9 years ago  No recent significant travel  Outpatient Encounter Medications as of 04/22/2018  Medication Sig  . albuterol (PROVENTIL HFA;VENTOLIN HFA) 108 (90 Base) MCG/ACT inhaler Inhale 2 puffs into the lungs every 6 (six) hours as needed.  . AMBULATORY NON FORMULARY MEDICATION CPAP MACHINE  . dicyclomine (BENTYL) 10 MG capsule TAKE 1 CAPSULE BY MOUTH 3 TIMES DAILY AS NEEDED  . esomeprazole (NEXIUM) 40 MG capsule Take 1 capsule (40 mg total) by mouth daily.  . fluticasone (FLONASE) 50 MCG/ACT nasal spray Place 2 sprays into both nostrils daily.  Marland Kitchen losartan (COZAAR) 50 MG tablet Take 1 tablet (50 mg total) by mouth daily.  . modafinil (PROVIGIL) 100 MG tablet Take 100 mg by mouth daily.  . mupirocin ointment (BACTROBAN) 2 % Apply 1 application topically 3 (three) times daily.  . Vitamin D, Ergocalciferol, (DRISDOL) 50000 units CAPS capsule Take 1 capsule (50,000 Units total) by mouth every 7 (seven) days.  . [DISCONTINUED] predniSONE (DELTASONE) 10 MG tablet 40 mg x 3 days, 20 mg x 3 days, 10 mg x 3 days   No facility-administered encounter medications on file as of 04/22/2018.     Allergies as of 04/22/2018  . (No Known Allergies)    Past Medical History:  Diagnosis Date  .  Chronic diarrhea   . Dyslipidemia   . Elevated prostate specific antigen (PSA)   . Hearing difficulty of both ears    wears hearing aids  . History of tobacco use   . Hypertrophy of prostate without urinary obstruction and other lower urinary tract symptoms (LUTS)   . Osteoarthritis   . Prostate cancer ([place]) 11/16/2012   T1c Gleason 6 prostate cancer  . Sleep apnea    CPAP Machine   . Unspecified intestinal obstruction     Past Surgical History:  Procedure Laterality Date  . ANKLE SURGERY    . APPENDECTOMY  1981   Also had intestinal tumor removed  . COLON RESECTION  1980   fibroid  . KNEE ARTHROSCOPY W/ MENISCAL REPAIR  2012   left knee  . PROSTATE BIOPSY  11/16/2012    Family History  Problem Relation Age of Onset  . Irritable bowel syndrome Mother   . Arthritis Unknown   . Colon cancer Maternal Grandfather   . Heart disease Maternal Grandfather   . Cancer Neg Hx     Social History   Socioeconomic History  . Marital status: Divorced    Spouse name: Not on file  . Number of children: 1  . Years of education: Not on file  . Highest education level: Not on file  Occupational History  . Occupation: Professor    Fish farm manager: [employer]    Comment: Nurse, learning disability  Social Needs  . Financial resource strain: Not on  file  . Food insecurity:    Worry: Not on file    Inability: Not on file  . Transportation needs:    Medical: Not on file    Non-medical: Not on file  Tobacco Use  . Smoking status: Former Smoker    Packs/day: 2.00    Years: 43.00    Pack years: 86.00    Types: Cigarettes    Last attempt to quit: 08/13/2008    Years since quitting: 9.6  . Smokeless tobacco: Never Used  Substance and Sexual Activity  . Alcohol use: No  . Drug use: No  . Sexual activity: Not on file  Lifestyle  . Physical activity:    Days per week: Not on file    Minutes per session: Not on file  . Stress: Not on file  Relationships  . Social connections:    Talks on phone: Not  on file    Gets together: Not on file    Attends religious service: Not on file    Active member of club or organization: Not on file    Attends meetings of clubs or organizations: Not on file    Relationship status: Not on file  . Intimate partner violence:    Fear of current or ex partner: Not on file    Emotionally abused: Not on file    Physically abused: Not on file    Forced sexual activity: Not on file  Other Topics Concern  . Not on file  Social History Narrative   Daily caffeine       Work or School: business, teaches, used to [employer] Situation: lives with wife      Spiritual Beliefs: none      Lifestyle: regular exercise and healthy diet             Review of Systems  Eyes: Negative.   Respiratory: Positive for apnea, cough and shortness of breath.   Cardiovascular: Negative.   Gastrointestinal: Negative.   Endocrine: Negative.   Musculoskeletal: Negative.   Psychiatric/Behavioral: Positive for sleep disturbance.  All other systems reviewed and are negative.   Vitals:   04/22/18 1131  BP: (!) 160/90  Pulse: 85  SpO2: 96%     Physical Exam  Constitutional: He is oriented to person, place, and time. He appears well-developed and well-nourished.  HENT:  Head: Normocephalic and atraumatic.  Eyes: Pupils are equal, round, and reactive to light. Conjunctivae and EOM are normal. Right eye exhibits no discharge. Left eye exhibits no discharge.  Neck: Normal range of motion. Neck supple.  Cardiovascular: Normal rate and regular rhythm.  Pulmonary/Chest: Effort normal. No respiratory distress. He has wheezes. He has rales.  Abdominal: Soft. Bowel sounds are normal. He exhibits no distension. There is no tenderness.  Musculoskeletal: Normal range of motion. He exhibits no edema.  Neurological: He is alert and oriented to person, place, and time.  Skin: Skin is warm and dry. No erythema.  Psychiatric: He has a normal mood and affect.   Data  Reviewed: Chest x-ray with congestion, increased markings   Assessment:  Persistent bronchitis Abnormal chest x-ray showing increased Shortness of breath Possible obstructive lung disease  Plan/Recommendations:  Obtain sputum for Gram stain and cultures  Plan for CT scan of the chest-patient has significant concerns about the cost  Bronchoscopy discussed as an option if we are concerned about mucous plugging-CT will guide regarding area to focus on  We will give him a  course of prednisone 10 p.o. twice daily  Will not repeat antibiotics until we get some guidance from the sputum cultures  He will continue to keep his CPAP supplies as clean as possible  Tentatively, I will see him back in about 4 weeks  Chest x-ray with a concern for interstitial lung disease  Sherrilyn Rist MD [facility] 04/22/2018, 12:03 PM  CC: Lucretia Kern, DO

## 2018-04-22 NOTE — Patient Instructions (Signed)
Persistent bronchitis  Multiple courses of antibiotics  Levaquin did help recently -I do not want to put you on another course of antibiotics until we get sputum to guide [place]   We will obtain sputum for Gram stain and cultures Short course of prednisone Obtain CT scan of the chest--depending on the cost--I believe it will give [place] more information  I will see you back in the office in about 4 weeks tentatively

## 2018-04-23 ENCOUNTER — Ambulatory Visit (INDEPENDENT_AMBULATORY_CARE_PROVIDER_SITE_OTHER): Payer: Medicare HMO

## 2018-04-23 DIAGNOSIS — R0602 Shortness of breath: Secondary | ICD-10-CM | POA: Diagnosis not present

## 2018-04-23 DIAGNOSIS — J849 Interstitial pulmonary disease, unspecified: Secondary | ICD-10-CM

## 2018-04-23 DIAGNOSIS — K760 Fatty (change of) liver, not elsewhere classified: Secondary | ICD-10-CM | POA: Diagnosis not present

## 2018-04-23 DIAGNOSIS — M47892 Other spondylosis, cervical region: Secondary | ICD-10-CM

## 2018-04-23 DIAGNOSIS — I700 Atherosclerosis of aorta: Secondary | ICD-10-CM | POA: Diagnosis not present

## 2018-04-25 ENCOUNTER — Telehealth: Payer: Self-pay | Admitting: Pulmonary Disease

## 2018-04-25 MED ORDER — CLARITHROMYCIN 500 MG PO TABS: 500.0000 mg | ORAL_TABLET | Freq: Two times a day (BID) | ORAL | 0 refills | Status: DC

## 2018-04-25 NOTE — Telephone Encounter (Signed)
Reviewed  I will give him a course of antibiotics and steroids  Biaxin 500 p.o. twice daily for 10 days This is an add-on to the prednisone he recently started on 10th  If nonresolution of symptoms following the steroids and antibiotics then will plan for bronchoscopy-this was already discussed with him during the visit

## 2018-04-26 LAB — RESPIRATORY CULTURE OR RESPIRATORY AND SPUTUM CULTURE
MICRO NUMBER: 91088200
RESULT:: NORMAL
SPECIMEN QUALITY:: ADEQUATE

## 2018-04-28 DIAGNOSIS — R05 Cough: Secondary | ICD-10-CM | POA: Diagnosis not present

## 2018-04-28 DIAGNOSIS — G4733 Obstructive sleep apnea (adult) (pediatric): Secondary | ICD-10-CM | POA: Diagnosis not present

## 2018-04-28 DIAGNOSIS — K219 Gastro-esophageal reflux disease without esophagitis: Secondary | ICD-10-CM | POA: Diagnosis not present

## 2018-04-28 DIAGNOSIS — R490 Dysphonia: Secondary | ICD-10-CM | POA: Diagnosis not present

## 2018-04-28 MED ORDER — PREDNISONE 10 MG PO TABS: ORAL_TABLET | ORAL | 0 refills | Status: DC

## 2018-04-28 NOTE — Telephone Encounter (Signed)
Called patient is aware of these recommendations.

## 2018-05-06 ENCOUNTER — Other Ambulatory Visit: Payer: Self-pay | Admitting: Pulmonary Disease

## 2018-05-09 ENCOUNTER — Other Ambulatory Visit: Payer: Self-pay | Admitting: Pulmonary Disease

## 2018-05-15 ENCOUNTER — Other Ambulatory Visit: Payer: Self-pay | Admitting: Pulmonary Disease

## 2018-05-16 ENCOUNTER — Telehealth: Payer: Self-pay | Admitting: Pulmonary Disease

## 2018-05-16 NOTE — Telephone Encounter (Signed)
Did he already take Biaxin and is requesting another course of abx??? Dr. Jenetta Downer prescribed Biaxin for 10 days only back on 09/13, needs OV before we will prescribe another abx

## 2018-05-16 NOTE — Telephone Encounter (Signed)
Attempted to call pt but line went straight to VM.  Left message for pt to return call x1 

## 2018-05-16 NOTE — Telephone Encounter (Signed)
Called and spoke to patient. Patient was upset that the Biaxin could not be refilled at this time. Patient stated that he felt better while he was on it for the 10 days but that he feels like his "lungs are full of shit."  Patient stated that he can't afford more testing. Patient stated that he thinks his body will heal itself and that he doesn't want to try any "exotic meds."  Assured patient that our providers will keep his best interest in mind and do what they can with whatever restrictions are given. Patient has an appointment with Dr. Ander Slade on 05/20/18. Patient stated that he will talk with MD at that time and hopes to come up with a good plan of care. Nothing further needed at this time.  Routing to Dr. Ander Slade as Juluis Rainier to review prior to patient appointment.

## 2018-05-16 NOTE — Telephone Encounter (Signed)
Refill request for Biaxin   Last OV: 04/22/18 Next OV: 05/20/2018  Last ordered by : Dr. Ander Slade Quantity: 18  Dr. Karsten Fells please advise  Patient states he has called and had pharmacy call 10 times and still nothing had been ordered.  No Known Allergies  Current Outpatient Medications on File Prior to Visit  Medication Sig Dispense Refill  . albuterol (PROVENTIL HFA;VENTOLIN HFA) 108 (90 Base) MCG/ACT inhaler Inhale 2 puffs into the lungs every 6 (six) hours as needed. 1 Inhaler 0  . AMBULATORY NON FORMULARY MEDICATION CPAP MACHINE    . clarithromycin (BIAXIN) 500 MG tablet Take 1 tablet (500 mg total) by mouth 2 (two) times daily. 20 tablet 0  . dicyclomine (BENTYL) 10 MG capsule TAKE 1 CAPSULE BY MOUTH 3 TIMES DAILY AS NEEDED 90 capsule 2  . esomeprazole (NEXIUM) 40 MG capsule Take 1 capsule (40 mg total) by mouth daily. 30 capsule 3  . fluticasone (FLONASE) 50 MCG/ACT nasal spray Place 2 sprays into both nostrils daily. 16 g 6  . losartan (COZAAR) 50 MG tablet Take 1 tablet (50 mg total) by mouth daily. 90 tablet 1  . modafinil (PROVIGIL) 100 MG tablet Take 100 mg by mouth daily.    . mupirocin ointment (BACTROBAN) 2 % Apply 1 application topically 3 (three) times daily. 15 g 0  . predniSONE (DELTASONE) 10 MG tablet 10mg  bid for 10 days then stop. 20 tablet 0  . Vitamin D, Ergocalciferol, (DRISDOL) 50000 units CAPS capsule Take 1 capsule (50,000 Units total) by mouth every 7 (seven) days. 12 capsule 0   No current facility-administered medications on file prior to visit.

## 2018-05-16 NOTE — Telephone Encounter (Signed)
Pt is calling back [phone]

## 2018-05-19 NOTE — Telephone Encounter (Signed)
Dr.Olalere did you wsnt to refill this antibiotic?

## 2018-05-20 ENCOUNTER — Ambulatory Visit: Payer: Medicare HMO | Admitting: Pulmonary Disease

## 2018-05-20 ENCOUNTER — Encounter: Payer: Self-pay | Admitting: Pulmonary Disease

## 2018-05-20 VITALS — BP 148/82 | HR 83 | Ht 69.5 in | Wt 217.4 lb

## 2018-05-20 DIAGNOSIS — R05 Cough: Secondary | ICD-10-CM | POA: Diagnosis not present

## 2018-05-20 DIAGNOSIS — R059 Cough, unspecified: Secondary | ICD-10-CM

## 2018-05-20 MED ORDER — CLARITHROMYCIN 500 MG PO TABS: 500.0000 mg | ORAL_TABLET | Freq: Two times a day (BID) | ORAL | 0 refills | Status: AC

## 2018-05-20 MED ORDER — PREDNISONE 10 MG PO TABS: 10.0000 mg | ORAL_TABLET | Freq: Every day | ORAL | 0 refills | Status: AC

## 2018-05-20 NOTE — Progress Notes (Signed)
Brett Tate,   Looks like this pt had a thyroid nodule on a CT scan done with the pulmonologist. The radiologist recommended follow up. Can you call him to set up an appointment with me to discuss?   Dr. Ander Tate,  Thank you for taking care of Brett Tate. In the future, if you see any abnormal things on a scan or test that you order that you would like for [place] to address, could you please call our office, send a request in epic or ensure the patient has appointment with [place] to address? Alternatively, you could order follow up or evaluation for the issue directly. This will help to ensure things don't fall through the cracks. I try to thoroughly read speciality notes, imaging specialists order, etc the best I can - but, my inbasket is refilling by the second and I could easily overlook something like this.  Thank you so very much.   Jarrett Soho

## 2018-05-20 NOTE — Patient Instructions (Signed)
Persistence of cough and shortness of breath  We will repeat Biaxin for 10 days Prednisone for 10 days  Repeat sputum cultures  I will see him back in the office in 3 months  Encouraged to give me a call if any significant concerns or persistence of symptoms Bronchoscopy discussed

## 2018-05-20 NOTE — Progress Notes (Signed)
Brett Tate    [id number]    May 26, 1946  Primary Care Physician:Kim, Nickola Major, DO  Referring Physician: Lucretia Kern, DO [address] [place], [place] [address]  Chief complaint:   Persistent cough and shortness of breath Persistence of symptoms despite recent treatment with a course of antibiotics  HPI:  Persistent bronchitis Treated with a course of Biaxin and prednisone, symptoms did improve for a few days but recurred He did have sputum sent for Gram stain and cultures which were negative-did show gram-positive cocci but nothing grew out of the sample  Reformed smoker Was never diagnosed with any lung disease while he was smoking and soon after he quit started having symptoms of chronic cough  Cough, shortness of breath, thick green mucus production-consistency and amount of sputum is improved  Pets: dog Occupation: Office work Exposures: No significant exposure to [employer] does wear his CPAP on a regular basis and is unsure-cleans it regularly Smoking history: Quit smoking about 9 years ago  No recent significant travel  Outpatient Encounter Medications as of 05/20/2018  Medication Sig  . albuterol (PROVENTIL HFA;VENTOLIN HFA) 108 (90 Base) MCG/ACT inhaler Inhale 2 puffs into the lungs every 6 (six) hours as needed.  . AMBULATORY NON FORMULARY MEDICATION CPAP MACHINE  . dicyclomine (BENTYL) 10 MG capsule TAKE 1 CAPSULE BY MOUTH 3 TIMES DAILY AS NEEDED  . esomeprazole (NEXIUM) 40 MG capsule Take 1 capsule (40 mg total) by mouth daily.  . fluticasone (FLONASE) 50 MCG/ACT nasal spray Place 2 sprays into both nostrils daily.  Marland Kitchen losartan (COZAAR) 50 MG tablet Take 1 tablet (50 mg total) by mouth daily.  . modafinil (PROVIGIL) 100 MG tablet Take 100 mg by mouth daily.  . mupirocin ointment (BACTROBAN) 2 % Apply 1 application topically 3 (three) times daily.  . Vitamin D, Ergocalciferol, (DRISDOL) 50000 units CAPS capsule Take 1 capsule (50,000  Units total) by mouth every 7 (seven) days.  . clarithromycin (BIAXIN) 500 MG tablet Take 1 tablet (500 mg total) by mouth 2 (two) times daily for 10 days.  . predniSONE (DELTASONE) 10 MG tablet Take 1 tablet (10 mg total) by mouth daily with breakfast for 10 days.  . [DISCONTINUED] clarithromycin (BIAXIN) 500 MG tablet Take 1 tablet (500 mg total) by mouth 2 (two) times daily.  . [DISCONTINUED] predniSONE (DELTASONE) 10 MG tablet 10mg  bid for 10 days then stop.   No facility-administered encounter medications on file as of 05/20/2018.     Allergies as of 05/20/2018  . (No Known Allergies)    Past Medical History:  Diagnosis Date  . Chronic diarrhea   . Dyslipidemia   . Elevated prostate specific antigen (PSA)   . Hearing difficulty of both ears    wears hearing aids  . History of tobacco use   . Hypertrophy of prostate without urinary obstruction and other lower urinary tract symptoms (LUTS)   . Osteoarthritis   . Prostate cancer ([place]) 11/16/2012   T1c Gleason 6 prostate cancer  . Sleep apnea    CPAP Machine   . Unspecified intestinal obstruction     Past Surgical History:  Procedure Laterality Date  . ANKLE SURGERY    . APPENDECTOMY  1981   Also had intestinal tumor removed  . COLON RESECTION  1980   fibroid  . KNEE ARTHROSCOPY W/ MENISCAL REPAIR  2012   left knee  . PROSTATE BIOPSY  11/16/2012    Family History  Problem Relation Age of Onset  . Irritable bowel syndrome Mother   . Arthritis Unknown   . Colon cancer Maternal Grandfather   . Heart disease Maternal Grandfather   . Cancer Neg Hx     Social History   Socioeconomic History  . Marital status: Divorced    Spouse name: Not on file  . Number of children: 1  . Years of education: Not on file  . Highest education level: Not on file  Occupational History  . Occupation: Professor    Fish farm manager: [employer]    Comment: Nurse, learning disability  Social Needs  . Financial resource strain: Not on file  . Food  insecurity:    Worry: Not on file    Inability: Not on file  . Transportation needs:    Medical: Not on file    Non-medical: Not on file  Tobacco Use  . Smoking status: Former Smoker    Packs/day: 2.00    Years: 43.00    Pack years: 86.00    Types: Cigarettes    Last attempt to quit: 08/13/2008    Years since quitting: 9.7  . Smokeless tobacco: Never Used  Substance and Sexual Activity  . Alcohol use: No  . Drug use: No  . Sexual activity: Not on file  Lifestyle  . Physical activity:    Days per week: Not on file    Minutes per session: Not on file  . Stress: Not on file  Relationships  . Social connections:    Talks on phone: Not on file    Gets together: Not on file    Attends religious service: Not on file    Active member of club or organization: Not on file    Attends meetings of clubs or organizations: Not on file    Relationship status: Not on file  . Intimate partner violence:    Fear of current or ex partner: Not on file    Emotionally abused: Not on file    Physically abused: Not on file    Forced sexual activity: Not on file  Other Topics Concern  . Not on file  Social History Narrative   Daily caffeine       Work or School: business, teaches, used to [employer] Situation: lives with wife      Spiritual Beliefs: none      Lifestyle: regular exercise and healthy diet             Review of Systems  Constitutional: Negative for activity change.  HENT: Negative for congestion, postnasal drip, sinus pressure and sneezing.   Eyes: Negative.   Respiratory: Positive for cough and shortness of breath.   Cardiovascular: Negative.   Gastrointestinal: Negative.   Psychiatric/Behavioral: Positive for sleep disturbance.  All other systems reviewed and are negative.  Vitals:   05/20/18 0952  BP: (!) 148/82  Pulse: 83  SpO2: 93%     Physical Exam  Constitutional: He appears well-developed and well-nourished.  HENT:  Head: Normocephalic and  atraumatic.  Eyes: Pupils are equal, round, and reactive to light. Conjunctivae and EOM are normal. Right eye exhibits no discharge. Left eye exhibits no discharge.  Neck: Normal range of motion. Neck supple.  Cardiovascular: Normal rate and regular rhythm.  Pulmonary/Chest: Effort normal. No respiratory distress. He has wheezes. He has rales.   Data Reviewed: Chest x-ray with congestion, increased markings Discussed of the chest shows no significant abnormality, some areas of atelectasis  Assessment:  Persistent bronchitis -Symptoms did improve transiently however record -Overall better  Abnormal chest x-ray showing increased -Lobe CT did not reveal any significant abnormality, does have a thyroid nodule-we will defer to primary -Atelectasis, likely related to ongoing symptoms.  No mass lesion  Shortness of breath -Overall better  Possible obstructive lung disease  Plan/Recommendations:  Obtain sputum for Gram stain and cultures-previous one did not reveal any significant organisms  Bronchoscopy discussed as an option If nonresolution of symptoms despite repeat course of treatment  We will give him a course of prednisone 10 p.o. twice daily Biaxin 500 p.o. twice daily for 10 days  He will continue to keep his CPAP supplies as clean as possible  Tentatively, I will see him back in about 2 months  Scan of the chest did not show any evidence of interstitial lung disease  Encouraged to call if any significant symptoms  Sherrilyn Rist MD [facility] 05/20/2018, 10:44 AM  CC: Lucretia Kern, DO

## 2018-05-27 ENCOUNTER — Telehealth: Payer: Self-pay | Admitting: Pulmonary Disease

## 2018-05-27 NOTE — Telephone Encounter (Signed)
ATC pt, no answer. Left message for pt to call back.  

## 2018-05-28 NOTE — Telephone Encounter (Signed)
Called and spoke with pt who states he can no longer take the prednisone due to it raising BP.  Pt wants to know if there is an inhaled steroid Rx that pt can take instead.  Dr. Ander Slade, please advise on this for pt. Thanks!

## 2018-05-28 NOTE — Telephone Encounter (Signed)
Patient returned call, CB is [phone]

## 2018-05-28 NOTE — Telephone Encounter (Signed)
LMTCB

## 2018-05-29 MED ORDER — FLUTICASONE PROPIONATE (INHAL) 250 MCG/BLIST IN AEPB: 1.0000 | INHALATION_SPRAY | Freq: Two times a day (BID) | RESPIRATORY_TRACT | 5 refills | Status: DC

## 2018-05-29 NOTE — Telephone Encounter (Signed)
Spoke with the pt  I advised Dr Ander Slade gave alternative to pred- flovent  He wants to try this so I have sent rx to pharm  I advised that he call for appt if not improving  Nothing further needed per pt

## 2018-05-29 NOTE — Telephone Encounter (Signed)
Flovent diskus-250 1 puff twice daily

## 2018-05-29 NOTE — Telephone Encounter (Signed)
Patient returned phone call; upset he is not able to speak to anyone; having trouble breathing; wheezing, and a cough; having trouble getting around; states he does not have the finances to keep going through this;

## 2018-07-23 DIAGNOSIS — S3991XA Unspecified injury of abdomen, initial encounter: Secondary | ICD-10-CM | POA: Diagnosis not present

## 2018-07-23 DIAGNOSIS — M25532 Pain in left wrist: Secondary | ICD-10-CM | POA: Diagnosis not present

## 2018-07-23 DIAGNOSIS — K4020 Bilateral inguinal hernia, without obstruction or gangrene, not specified as recurrent: Secondary | ICD-10-CM | POA: Diagnosis not present

## 2018-07-23 DIAGNOSIS — Z87891 Personal history of nicotine dependence: Secondary | ICD-10-CM | POA: Diagnosis not present

## 2018-07-23 DIAGNOSIS — E041 Nontoxic single thyroid nodule: Secondary | ICD-10-CM | POA: Diagnosis not present

## 2018-07-23 DIAGNOSIS — S299XXA Unspecified injury of thorax, initial encounter: Secondary | ICD-10-CM | POA: Diagnosis not present

## 2018-07-23 DIAGNOSIS — R911 Solitary pulmonary nodule: Secondary | ICD-10-CM | POA: Diagnosis not present

## 2018-07-23 DIAGNOSIS — R918 Other nonspecific abnormal finding of lung field: Secondary | ICD-10-CM | POA: Diagnosis not present

## 2018-07-23 DIAGNOSIS — S0083XA Contusion of other part of head, initial encounter: Secondary | ICD-10-CM | POA: Diagnosis not present

## 2018-07-23 DIAGNOSIS — S3993XA Unspecified injury of pelvis, initial encounter: Secondary | ICD-10-CM | POA: Diagnosis not present

## 2018-07-23 DIAGNOSIS — S6992XA Unspecified injury of left wrist, hand and finger(s), initial encounter: Secondary | ICD-10-CM | POA: Diagnosis not present

## 2018-07-23 DIAGNOSIS — Z041 Encounter for examination and observation following transport accident: Secondary | ICD-10-CM | POA: Diagnosis not present

## 2018-07-23 DIAGNOSIS — Z23 Encounter for immunization: Secondary | ICD-10-CM | POA: Diagnosis not present

## 2018-07-23 DIAGNOSIS — S0590XA Unspecified injury of unspecified eye and orbit, initial encounter: Secondary | ICD-10-CM | POA: Diagnosis not present

## 2018-07-23 DIAGNOSIS — K5730 Diverticulosis of large intestine without perforation or abscess without bleeding: Secondary | ICD-10-CM | POA: Diagnosis not present

## 2018-07-23 DIAGNOSIS — Z79899 Other long term (current) drug therapy: Secondary | ICD-10-CM | POA: Diagnosis not present

## 2018-07-23 DIAGNOSIS — S0993XA Unspecified injury of face, initial encounter: Secondary | ICD-10-CM | POA: Diagnosis not present

## 2018-07-23 DIAGNOSIS — S60812A Abrasion of left wrist, initial encounter: Secondary | ICD-10-CM | POA: Diagnosis not present

## 2018-07-23 DIAGNOSIS — S20219A Contusion of unspecified front wall of thorax, initial encounter: Secondary | ICD-10-CM | POA: Diagnosis not present

## 2018-07-23 DIAGNOSIS — J9811 Atelectasis: Secondary | ICD-10-CM | POA: Diagnosis not present

## 2018-07-23 DIAGNOSIS — K760 Fatty (change of) liver, not elsewhere classified: Secondary | ICD-10-CM | POA: Diagnosis not present

## 2018-07-23 DIAGNOSIS — R220 Localized swelling, mass and lump, head: Secondary | ICD-10-CM | POA: Diagnosis not present

## 2018-07-23 DIAGNOSIS — K420 Umbilical hernia with obstruction, without gangrene: Secondary | ICD-10-CM | POA: Diagnosis not present

## 2018-07-23 DIAGNOSIS — K7689 Other specified diseases of liver: Secondary | ICD-10-CM | POA: Diagnosis not present

## 2018-08-11 DIAGNOSIS — J42 Unspecified chronic bronchitis: Secondary | ICD-10-CM | POA: Diagnosis not present

## 2018-08-11 DIAGNOSIS — E559 Vitamin D deficiency, unspecified: Secondary | ICD-10-CM | POA: Diagnosis not present

## 2018-08-11 DIAGNOSIS — Z125 Encounter for screening for malignant neoplasm of prostate: Secondary | ICD-10-CM | POA: Diagnosis not present

## 2018-08-11 DIAGNOSIS — J449 Chronic obstructive pulmonary disease, unspecified: Secondary | ICD-10-CM | POA: Diagnosis not present

## 2018-08-11 DIAGNOSIS — R972 Elevated prostate specific antigen [PSA]: Secondary | ICD-10-CM | POA: Diagnosis not present

## 2018-08-11 DIAGNOSIS — E279 Disorder of adrenal gland, unspecified: Secondary | ICD-10-CM | POA: Diagnosis not present

## 2018-08-11 DIAGNOSIS — J370 Chronic laryngitis: Secondary | ICD-10-CM | POA: Diagnosis not present

## 2018-08-11 DIAGNOSIS — Z77120 Contact with and (suspected) exposure to mold (toxic): Secondary | ICD-10-CM | POA: Diagnosis not present

## 2018-08-15 ENCOUNTER — Other Ambulatory Visit: Payer: Self-pay

## 2018-08-15 ENCOUNTER — Emergency Department (INDEPENDENT_AMBULATORY_CARE_PROVIDER_SITE_OTHER)
Admission: EM | Admit: 2018-08-15 | Discharge: 2018-08-15 | Disposition: A | Discharge status: Home / Self Care | Payer: Medicare HMO | Source: Home / Self Care | Attending: Emergency Medicine | Admitting: Emergency Medicine

## 2018-08-15 DIAGNOSIS — S0502XA Injury of conjunctiva and corneal abrasion without foreign body, left eye, initial encounter: Secondary | ICD-10-CM

## 2018-08-15 DIAGNOSIS — R030 Elevated blood-pressure reading, without diagnosis of hypertension: Secondary | ICD-10-CM | POA: Diagnosis not present

## 2018-08-15 MED ORDER — OFLOXACIN 0.3 % OP SOLN: 1.0000 [drp] | Freq: Four times a day (QID) | OPHTHALMIC | 0 refills | Status: DC

## 2018-08-15 NOTE — Discharge Instructions (Addendum)
Use eyedrops as instructed. Tape your eye closed if you have significant discomfort. Make a follow-up appointment to see your eye physician on Monday. Please stop your decongestants and antihistamines you are taking for your laryngitis. Check with the pharmacist what drugs are safe for you to take with your hypertension.  Follow-up with your hypertension with primary care provider

## 2018-08-15 NOTE — ED Triage Notes (Signed)
Pt thought he had an eyelash in his eye yesterday morning, and it has become red, and has drainage.   Also sore throat and hoarseness since June

## 2018-08-15 NOTE — ED Provider Notes (Signed)
[name]    CSN: [id number] Arrival date & time: 08/15/18  1156     History   Chief Complaint Chief Complaint  Patient presents with  . Eye Problem  . Sore Throat    HPI Brett Tate. is a 73 y.o. male.   HPI Patient enters with discomfort in his left.  Yesterday he thought he had gotten a flash in his eye and he presents today with severe photophobia pain and swelling around the left eye.  His vision is somewhat distorted but he feels better with his eyelid closed. Past Medical History:  Diagnosis Date  . Chronic diarrhea   . Dyslipidemia   . Elevated prostate specific antigen (PSA)   . Hearing difficulty of both ears    wears hearing aids  . History of tobacco use   . Hypertrophy of prostate without urinary obstruction and other lower urinary tract symptoms (LUTS)   . Osteoarthritis   . Prostate cancer ([place]) 11/16/2012   T1c Gleason 6 prostate cancer  . Sleep apnea    CPAP Machine   . Unspecified intestinal obstruction     Patient Active Problem List   Diagnosis Date Noted  . Low back pain 11/20/2016  . Synovitis of finger 11/20/2016  . Patellofemoral arthritis of left knee 11/20/2016  . Nonallopathic lesion of thoracic region 11/20/2016  . Nonallopathic lesion of lumbosacral region 11/20/2016  . Nonallopathic lesion of sacral region 11/20/2016  . Perianal irritation 07/04/2016  . Family hx of colon cancer 07/04/2016  . Diarrhea 07/04/2016  . IBS (irritable bowel syndrome) 06/28/2016  . Obstructive sleep apnea 06/28/2016  . Essential hypertension 06/28/2016  . BMI 31.0-31.9,adult 06/28/2016  . Malignant neoplasm of prostate ([place]) 05/20/2013  . Hand arthritis 09/03/2012  . Allergic dermatitis 08/20/2012  . Dyslipidemia 08/20/2012    Past Surgical History:  Procedure Laterality Date  . ANKLE SURGERY    . APPENDECTOMY  1981   Also had intestinal tumor removed  . COLON RESECTION  1980   fibroid  . KNEE ARTHROSCOPY W/ MENISCAL  REPAIR  2012   left knee  . PROSTATE BIOPSY  11/16/2012       Home Medications    Prior to Admission medications   Medication Sig Start Date End Date Taking? Authorizing Provider  albuterol (PROVENTIL HFA;VENTOLIN HFA) 108 (90 Base) MCG/ACT inhaler Inhale 2 puffs into the lungs every 6 (six) hours as needed. 04/17/18   Lucretia Kern, DO  AMBULATORY NON FORMULARY MEDICATION CPAP MACHINE    [provider]  dicyclomine (BENTYL) 10 MG capsule TAKE 1 CAPSULE BY MOUTH 3 TIMES DAILY AS NEEDED 06/28/16   Lucretia Kern, DO  esomeprazole (NEXIUM) 40 MG capsule Take 1 capsule (40 mg total) by mouth daily. 04/17/18   Lucretia Kern, DO  fluticasone (FLONASE) 50 MCG/ACT nasal spray Place 2 sprays into both nostrils daily. 04/17/18   Lucretia Kern, DO  Fluticasone Propionate, Inhal, 250 MCG/BLIST AEPB Inhale 1 puff into the lungs 2 (two) times daily. 05/29/18   Laurin Coder, MD  losartan (COZAAR) 50 MG tablet Take 1 tablet (50 mg total) by mouth daily. 04/18/18   Lucretia Kern, DO  modafinil (PROVIGIL) 100 MG tablet Take 100 mg by mouth daily.    [provider]  mupirocin ointment (BACTROBAN) 2 % Apply 1 application topically 3 (three) times daily. 03/16/17   Kandra Nicolas, MD  ofloxacin (OCUFLOX) 0.3 % ophthalmic solution Place 1 drop into the left  eye 4 (four) times daily. 08/15/18   Darlyne Russian, MD  Vitamin D, Ergocalciferol, (DRISDOL) 50000 units CAPS capsule Take 1 capsule (50,000 Units total) by mouth every 7 (seven) days. 01/02/17   Lyndal Pulley, DO    Family History Family History  Problem Relation Age of Onset  . Irritable bowel syndrome Mother   . Arthritis Other   . Colon cancer Maternal Grandfather   . Heart disease Maternal Grandfather   . Cancer Neg Hx     Social History Social History   Tobacco Use  . Smoking status: Former Smoker    Packs/day: 2.00    Years: 43.00    Pack years: 86.00    Types: Cigarettes    Last attempt to quit: 08/13/2008    Years  since quitting: 10.0  . Smokeless tobacco: Never Used  Substance Use Topics  . Alcohol use: No  . Drug use: No     Allergies   Patient has no known allergies.   Review of Systems Review of Systems  Constitutional: Negative.   HENT:       He is bothered with laryngitis and has undergone evaluation from GI and from ENT and has had endoscopy  Eyes: Positive for photophobia, pain, discharge, redness and visual disturbance.  Respiratory: Negative.      Physical Exam Triage Vital Signs ED Triage Vitals  Enc Vitals Group     BP 08/15/18 1227 (!) 189/118     Pulse Rate 08/15/18 1227 84     Resp 08/15/18 1227 18     Temp 08/15/18 1227 98.2 F (36.8 C)     Temp Source 08/15/18 1227 Oral     SpO2 08/15/18 1227 94 %     Weight 08/15/18 1228 223 lb (101.2 kg)     Height 08/15/18 1228 5\' 10"  (1.778 m)     Head Circumference --      Peak Flow --      Pain Score 08/15/18 1228 0     Pain Loc --      Pain Edu? --      Excl. in [place]? --    No data found.  Updated Vital Signs BP (!) 178/113 (BP Location: Left Arm)   Pulse 84   Temp 98.2 F (36.8 C) (Oral)   Resp 18   Ht 5\' 10"  (1.778 m)   Wt 101.2 kg   SpO2 94%   BMI 32.00 kg/m   Visual Acuity Right Eye Distance: 20/30 Left Eye Distance: 20/70 Bilateral Distance: 20/50  Right Eye Near:   Left Eye Near:    Bilateral Near:     Physical Exam Constitutional:      Appearance: He is well-developed.  HENT:     Head: Normocephalic.     Mouth/Throat:     Mouth: Mucous membranes are moist.  Eyes:     Comments: The conjunctive is injected.  There is swelling of the upper and lower lid.  There was immediate relief of pain after instillation of 3 drops of Pontocaine.  Lid eversion revealed no foreign body and the lid was swabbed.  Floor seen had significant uptake over the midportion of the pupil 4 x 4 millimeters.  Neurological:     Mental Status: He is alert.      UC Treatments / Results  Labs (all labs ordered are  listed, but only abnormal results are displayed) Labs Reviewed - No data to display  EKG None  Radiology No results found.  Procedures Procedures (including critical care time)  Medications Ordered in UC Medications - No data to display  Initial Impression / Assessment and Plan / UC Course  I have reviewed the triage vital signs and the nursing notes.  Pertinent labs & imaging results that were available during my care of the patient were reviewed by me and considered in my medical decision making (see chart for details). Patient has a significant corneal abrasion involving the left cornea.  Will treat with Ocuflox drops.  He was instructed to follow-up with his eye doctor on Monday to be sure he is clearing.  Advised him to tape his eye shut if he had significant pain.  Patient has been taking significant amounts of decongestants.  He knows he is not supposed to do this he was advised to discuss this with the pharmacist to be sure he is on medications that will not increase his blood pressure.     Final Clinical Impressions(s) / UC Diagnoses   Final diagnoses:  Abrasion of left cornea, initial encounter  Elevated blood pressure reading     Discharge Instructions     Use eyedrops as instructed. Tape your eye closed if you have significant discomfort. Make a follow-up appointment to see your eye physician on Monday. Please stop your decongestants and antihistamines you are taking for your laryngitis. Check with the pharmacist what drugs are safe for you to take with your hypertension.  Follow-up with your hypertension with primary care provider    ED Prescriptions    Medication Sig Dispense Auth. Provider   ofloxacin (OCUFLOX) 0.3 % ophthalmic solution Place 1 drop into the left eye 4 (four) times daily. 5 mL Darlyne Russian, MD     Controlled Substance Prescriptions [place] Controlled Substance Registry consulted? Not Applicable   Darlyne Russian, MD 08/15/18 [phone]

## 2018-08-17 ENCOUNTER — Telehealth: Payer: Self-pay

## 2018-08-17 ENCOUNTER — Telehealth: Payer: Self-pay | Admitting: Internal Medicine

## 2018-08-17 MED ORDER — KETOROLAC TROMETHAMINE 0.5 % OP SOLN: 1.0000 [drp] | Freq: Four times a day (QID) | OPHTHALMIC | 0 refills | Status: DC | PRN

## 2018-08-17 NOTE — Telephone Encounter (Signed)
Eye is feeling a little better, but extremely painful.  Will ask provider if there is any medication that will help with the pain.

## 2018-08-17 NOTE — Telephone Encounter (Signed)
Rx ketorolac eye gtts

## 2018-08-17 NOTE — Telephone Encounter (Signed)
Spoke with Dr Marcille Blanco, he will send eye drops to pharmacy for pain relief.  Called patient back, and left message on VM.

## 2018-08-18 DIAGNOSIS — S0502XA Injury of conjunctiva and corneal abrasion without foreign body, left eye, initial encounter: Secondary | ICD-10-CM | POA: Diagnosis not present

## 2018-10-07 ENCOUNTER — Telehealth: Payer: Self-pay | Admitting: *Deleted

## 2018-10-07 NOTE — Telephone Encounter (Signed)
This is a kit that will be sent to the patient once forms are signed by Dr. Maudie Mercury and returned.

## 2018-10-07 NOTE — Telephone Encounter (Signed)
Dr Maudie Mercury received a fax from [facility] with forms to be completed for a Hereditary Cancer Risk Assessment.  Dr Maudie Mercury stated the pt would need to see a genetics specialist and the referral can be placed.  She stated this is not a test that we order or are set up to interpret and if this is related to his prostate cancer, he should contact the urologist.  I left a detailed message with this information at the pts cell number.  CRM also created.

## 2018-10-08 NOTE — Telephone Encounter (Signed)
I left a detailed message for the pt to return my call as to what he would prefer, the referral or have the forms be given back to him to send to the urologist.

## 2018-10-09 NOTE — Progress Notes (Signed)
Subjective:   Brett Tate. is a 73 y.o. male who presents for Medicare Annual/Subsequent preventive examination.  Review of Systems:  No ROS.  Medicare Wellness Visit. Additional risk factors are reflected in the social history.  Cardiac Risk Factors include: dyslipidemia;male gender;hypertension;sedentary lifestyle;advanced age (>49men, >27 women) Sleep patterns: no sleep issues, feels rested on waking and does not get up to void. Pt. States he has not used his CPAP machine in awhile.  Home Safety/Smoke Alarms: Feels safe in home. Smoke alarms in place.  Living environment; residence and Firearm Safety: 1-story house/ trailer. No use or need for DME at this time.  Seat Belt Safety/Bike Helmet: Wears seat belt.   Male:   CCS- 09/2009; due 09/2020.   PSA-  Lab Results  Component Value Date   PSA 5.19 (H) 08/21/2012       Objective:    Vitals: BP (!) 166/110 (BP Location: Left Arm, Patient Position: Sitting, Cuff Size: Normal) Comment: pt. off losartan "for awhile"  Pulse 90   Temp 98.2 F (36.8 C)   Resp 20   Ht 5\' 10"  (1.778 m)   Wt 210 lb (95.3 kg)   SpO2 96%   BMI 30.13 kg/m   Body mass index is 30.13 kg/m.  Advanced Directives 10/10/2018 06/22/2014  Does Patient Have a Medical Advance Directive? Yes No  Type of Paramedic of [place];Living will -  Does patient want to make changes to medical advance directive? No - Patient declined -  Copy of [place] in Chart? No - copy requested -    Tobacco Social History   Tobacco Use  Smoking Status Former Smoker  . Packs/day: 2.00  . Years: 43.00  . Pack years: 86.00  . Types: Cigarettes  . Last attempt to quit: 08/13/2008  . Years since quitting: 10.1  Smokeless Tobacco Never Used     Counseling given: Not Answered   Past Medical History:  Diagnosis Date  . Chronic diarrhea   . Dyslipidemia   . Elevated prostate specific antigen (PSA)   . Hearing  difficulty of both ears    wears hearing aids  . History of tobacco use   . Hypertrophy of prostate without urinary obstruction and other lower urinary tract symptoms (LUTS)   . Osteoarthritis   . Prostate cancer ([place]) 11/16/2012   T1c Gleason 6 prostate cancer  . Sleep apnea    CPAP Machine   . Unspecified intestinal obstruction    Past Surgical History:  Procedure Laterality Date  . ANKLE SURGERY    . APPENDECTOMY  1981   Also had intestinal tumor removed  . COLON RESECTION  1980   fibroid  . KNEE ARTHROSCOPY W/ MENISCAL REPAIR  2012   left knee  . PROSTATE BIOPSY  11/16/2012   Family History  Problem Relation Age of Onset  . Irritable bowel syndrome Mother   . Arthritis Other   . Colon cancer Maternal Grandfather   . Heart disease Maternal Grandfather   . Cancer Neg Hx    Social History   Socioeconomic History  . Marital status: Single    Spouse name: Not on file  . Number of children: 1  . Years of education: Not on file  . Highest education level: Not on file  Occupational History  . Occupation: Professor    Fish farm manager: [employer]    Comment: Nurse, learning disability  . Occupation: Mining engineer    Comment: part time  Social Needs  .  Financial resource strain: Not hard at all  . Food insecurity:    Worry: Never true    Inability: Never true  . Transportation needs:    Medical: No    Non-medical: No  Tobacco Use  . Smoking status: Former Smoker    Packs/day: 2.00    Years: 43.00    Pack years: 86.00    Types: Cigarettes    Last attempt to quit: 08/13/2008    Years since quitting: 10.1  . Smokeless tobacco: Never Used  Substance and Sexual Activity  . Alcohol use: No  . Drug use: No  . Sexual activity: Not on file  Lifestyle  . Physical activity:    Days per week: 0 days    Minutes per session: 0 min  . Stress: Not at all  Relationships  . Social connections:    Talks on phone: Once a week    Gets together: Once a week    Attends religious service: Not on  file    Active member of club or organization: Not on file    Attends meetings of clubs or organizations: More than 4 times per year    Relationship status: Not on file  Other Topics Concern  . Not on file  Social History Narrative   Daily caffeine       Work or School: business, teaches, used to [employer] Situation: lives with wife      Spiritual Beliefs: none      Lifestyle: regular exercise and healthy diet      10/10/2018: Lives alone with dog in one level.   [place]ived [place], [place]. Has been in [place] X 5 years.    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 albuterol (PROVENTIL HFA;VENTOLIN HFA) 108 (90 Base) MCG/ACT inhaler Inhale 2 puffs into the lungs every 6 (six) hours as needed.  . AMBULATORY NON FORMULARY MEDICATION CPAP MACHINE  . dicyclomine (BENTYL) 10 MG capsule TAKE 1 CAPSULE BY MOUTH 3 TIMES DAILY AS NEEDED  . esomeprazole (NEXIUM) 40 MG capsule Take 1 capsule (40 mg total) by mouth daily.  . fluticasone (FLONASE) 50 MCG/ACT nasal spray Place 2 sprays into both nostrils daily.  . Fluticasone Propionate, Inhal, 250 MCG/BLIST AEPB Inhale 1 puff into the lungs 2 (two) times daily.  . Iodine Strong, Lugols, (IODINE STRONG PO) Take by mouth.  . modafinil (PROVIGIL) 100 MG tablet Take 100 mg by mouth daily.  . Vitamin D, Ergocalciferol, (DRISDOL) 50000 units CAPS capsule Take 1 capsule (50,000 Units total) by mouth every 7 (seven) days. (Patient taking differently: Take 50,000 Units by mouth daily. )  . [DISCONTINUED] dicyclomine (BENTYL) 10 MG capsule TAKE 1 CAPSULE BY MOUTH 3 TIMES DAILY AS NEEDED  . losartan (COZAAR) 50 MG tablet Take 1 tablet (50 mg total) by mouth daily.  . [DISCONTINUED] ketorolac (ACULAR) 0.5 % ophthalmic solution Place 1 drop into the left eye every 6 (six) hours as needed (pain). (Patient not taking: Reported on  10/10/2018)  . [DISCONTINUED] losartan (COZAAR) 50 MG tablet Take 1 tablet (50 mg total) by mouth daily. (Patient not taking: Reported on 10/10/2018)  . [DISCONTINUED] mupirocin ointment (BACTROBAN) 2 % Apply 1 application topically 3 (three) times daily. (Patient not taking: Reported on 10/10/2018)  . [DISCONTINUED]  ofloxacin (OCUFLOX) 0.3 % ophthalmic solution Place 1 drop into the left eye 4 (four) times daily. (Patient not taking: Reported on 10/10/2018)   No facility-administered encounter medications on file as of 10/10/2018.     Activities of Daily Living In your present state of health, do you have any difficulty performing the following activities: 10/10/2018  Hearing? N  Comment hearing aide effective  Vision? N  Difficulty concentrating or making decisions? N  Walking or climbing stairs? N  Dressing or bathing? N  Doing errands, shopping? N  Preparing Food and eating ? N  Using the Toilet? N  In the past six months, have you accidently leaked urine? N  Do you have problems with loss of bowel control? N  Managing your Medications? N  Managing your Finances? N  Housekeeping or managing your Housekeeping? N  Some recent data might be hidden    Patient Care Team: Lucretia Kern, DO as PCP - General (Family Medicine)   Assessment:   This is a routine wellness examination for Hayden. Physical assessment deferred to PCP.   Exercise Activities and Dietary recommendations Current Exercise Habits: The patient does not participate in regular exercise at present, Exercise limited by: cardiac condition(s);respiratory conditions(s) Diet (meal preparation, eat out, water intake, caffeinated beverages, dairy products, fruits and vegetables): in general, a "healthy" diet  , vegetarian. Has unintentionally been losing weight (about 10 pounds over last 4 months).        Goals    . Patient Stated     Get voice back!        Fall Risk Fall Risk  10/10/2018 08/10/2016 11/09/2013  Falls in  the past year? 0 No No    Depression Screen PHQ 2/9 Scores 10/10/2018 08/10/2016 11/09/2013  PHQ - 2 Score 0 0 0  PHQ- 9 Score 3 - -   Pt. Has been feeling very set back by 7 month-long laryngitis-like sx. PCP made aware, physical set up for 3/2.   Cognitive Function       Ad8 score reviewed for issues:  Issues making decisions: no  Less interest in hobbies / activities: no  Repeats questions, stories (family complaining): no  Trouble using ordinary gadgets (microwave, computer, phone):no  Forgets the month or year: no  Mismanaging finances: no  Remembering appts: no  Daily problems with thinking and/or memory: no Ad8 score is= 0  Immunization History  Administered Date(s) Administered  . Pneumococcal Polysaccharide-23 08/20/2012, 08/10/2016  . Tdap 11/09/2013    Qualifies for Shingles Vaccine? Yes, but not interested at this time.   Screening Tests Health Maintenance  Topic Date Due  . COLONOSCOPY  09/16/2019  . TETANUS/TDAP  11/10/2023  . Hepatitis C Screening  Completed  . INFLUENZA VACCINE  Discontinued  . PNA vac Low Risk Adult  Discontinued      Plan:    Bring a copy of your living will and/or healthcare power of attorney to your next office visit.  Follow-up with Dr. Maudie Mercury on Tuesday as scheduled.  Start losartan ASAP per Dr. Erick Blinks advice, and monitor BP at home.  If BP remains high (above 150/90), go to urgent care or ED.   I have personally reviewed and noted the following in the patient's chart:   . Medical and social history . Use of alcohol, tobacco or illicit drugs  . Current medications and supplements . Functional ability and status . Nutritional status . Physical activity . Advanced directives . List of other physicians . Vitals .  Screenings to include cognitive, depression, and falls . Referrals and appointments  In addition, I have reviewed and discussed with patient certain preventive protocols, quality metrics, and best  practice recommendations. A written personalized care plan for preventive services as well as general preventive health recommendations were provided to patient.     Alphia Moh, RN  10/10/2018

## 2018-10-10 ENCOUNTER — Ambulatory Visit (INDEPENDENT_AMBULATORY_CARE_PROVIDER_SITE_OTHER): Payer: Medicare HMO

## 2018-10-10 ENCOUNTER — Telehealth: Payer: Self-pay

## 2018-10-10 VITALS — BP 166/110 | HR 90 | Temp 98.2°F | Resp 20 | Ht 70.0 in | Wt 210.0 lb

## 2018-10-10 DIAGNOSIS — R05 Cough: Secondary | ICD-10-CM

## 2018-10-10 DIAGNOSIS — K589 Irritable bowel syndrome without diarrhea: Secondary | ICD-10-CM

## 2018-10-10 DIAGNOSIS — I10 Essential (primary) hypertension: Secondary | ICD-10-CM

## 2018-10-10 DIAGNOSIS — Z0000 Encounter for general adult medical examination without abnormal findings: Secondary | ICD-10-CM

## 2018-10-10 DIAGNOSIS — R059 Cough, unspecified: Secondary | ICD-10-CM

## 2018-10-10 MED ORDER — DICYCLOMINE HCL 10 MG PO CAPS: ORAL_CAPSULE | ORAL | 2 refills | Status: DC

## 2018-10-10 MED ORDER — LOSARTAN POTASSIUM 50 MG PO TABS: 50.0000 mg | ORAL_TABLET | Freq: Every day | ORAL | 1 refills | Status: DC

## 2018-10-10 NOTE — Telephone Encounter (Signed)
Agree needs appt - glad seeing me soon. Ok to place allergy referral if he wishes - [place] allergy for cough. Also, can you ask him to bring records from ENT and pulm? And if he wishes to address this will likely need to do the physical a different day so we have time to talk about this. Thanks.

## 2018-10-10 NOTE — Patient Instructions (Addendum)
Bring a copy of your living will and/or healthcare power of attorney to your next office visit.  Follow-up with Dr. Maudie Mercury on Tuesday as scheduled.  Start losartan ASAP per Dr. Erick Blinks advice, and monitor BP at home.  If BP remains high (above 150/90), go to urgent care or ED.   Health Maintenance, Male A healthy lifestyle and preventive care is important for your health and wellness. Ask your health care provider about what schedule of regular examinations is right for you. What should I know about weight and diet? Eat a Healthy Diet  Eat plenty of vegetables, fruits, whole grains, low-fat dairy products, and lean protein.  Do not eat a lot of foods high in solid fats, added sugars, or salt.  Maintain a Healthy Weight Regular exercise can help you achieve or maintain a healthy weight. You should:  Do at least 150 minutes of exercise each week. The exercise should increase your heart rate and make you sweat (moderate-intensity exercise).  Do strength-training exercises at least twice a week. Watch Your Levels of Cholesterol and Blood Lipids  Have your blood tested for lipids and cholesterol every 5 years starting at 72 years of age. If you are at high risk for heart disease, you should start having your blood tested when you are 72 years old. You may need to have your cholesterol levels checked more often if: ? Your lipid or cholesterol levels are high. ? You are older than 72 years of age. ? You are at high risk for heart disease. What should I know about cancer screening? Many types of cancers can be detected early and may often be prevented. Lung Cancer  You should be screened every year for lung cancer if: ? You are a current smoker who has smoked for at least 30 years. ? You are a former smoker who has quit within the past 15 years.  Talk to your health care provider about your screening options, when you should start screening, and how often you should be  screened. Colorectal Cancer  Routine colorectal cancer screening usually begins at 72 years of age and should be repeated every 5-10 years until you are 72 years old. You may need to be screened more often if early forms of precancerous polyps or small growths are found. Your health care provider may recommend screening at an earlier age if you have risk factors for colon cancer.  Your health care provider may recommend using home test kits to check for hidden blood in the stool.  A small camera at the end of a tube can be used to examine your colon (sigmoidoscopy or colonoscopy). This checks for the earliest forms of colorectal cancer. Prostate and Testicular Cancer  Depending on your age and overall health, your health care provider may do certain tests to screen for prostate and testicular cancer.  Talk to your health care provider about any symptoms or concerns you have about testicular or prostate cancer. Skin Cancer  Check your skin from head to toe regularly.  Tell your health care provider about any new moles or changes in moles, especially if: ? There is a change in a mole's size, shape, or color. ? You have a mole that is larger than a pencil eraser.  Always use sunscreen. Apply sunscreen liberally and repeat throughout the day.  Protect yourself by wearing long sleeves, pants, a wide-brimmed hat, and sunglasses when outside. What should I know about heart disease, diabetes, and high blood pressure?  If you  are 8-76 years of age, have your blood pressure checked every 3-5 years. If you are 68 years of age or older, have your blood pressure checked every year. You should have your blood pressure measured twice-once when you are at a hospital or clinic, and once when you are not at a hospital or clinic. Record the average of the two measurements. To check your blood pressure when you are not at a hospital or clinic, you can use: ? An automated blood pressure machine at a  pharmacy. ? A home blood pressure monitor.  Talk to your health care provider about your target blood pressure.  If you are between 25-8 years old, ask your health care provider if you should take aspirin to prevent heart disease.  Have regular diabetes screenings by checking your fasting blood sugar level. ? If you are at a normal weight and have a low risk for diabetes, have this test once every three years after the age of 85. ? If you are overweight and have a high risk for diabetes, consider being tested at a younger age or more often.  A one-time screening for abdominal aortic aneurysm (AAA) by ultrasound is recommended for men aged 79-75 years who are current or former smokers. What should I know about preventing infection? Hepatitis B If you have a higher risk for hepatitis B, you should be screened for this virus. Talk with your health care provider to find out if you are at risk for hepatitis B infection. Hepatitis C Blood testing is recommended for:  Everyone born from [birth year] through 1964.  Anyone with known risk factors for hepatitis C. Sexually Transmitted Diseases (STDs)  You should be screened each year for STDs including gonorrhea and chlamydia if: ? You are sexually active and are younger than 72 years of age. ? You are older than 72 years of age and your health care provider tells you that you are at risk for this type of infection. ? Your sexual activity has changed since you were last screened and you are at an increased risk for chlamydia or gonorrhea. Ask your health care provider if you are at risk.  Talk with your health care provider about whether you are at high risk of being infected with HIV. Your health care provider may recommend a prescription medicine to help prevent HIV infection. What else can I do?  Schedule regular health, dental, and eye exams.  Stay current with your vaccines (immunizations).  Do not use any tobacco products, such as cigarettes,  chewing tobacco, and e-cigarettes. If you need help quitting, ask your health care provider.  Limit alcohol intake to no more than 2 drinks per day. One drink equals 12 ounces of beer, 5 ounces of wine, or 1 ounces of hard liquor.  Do not use street drugs.  Do not share needles.  Ask your health care provider for help if you need support or information about quitting drugs.  Tell your health care provider if you often feel depressed.  Tell your health care provider if you have ever been abused or do not feel safe at home. This information is not intended to replace advice given to you by your health care provider. Make sure you discuss any questions you have with your health care provider. Document Released: 01/26/2008 Document Revised: 03/28/2016 Document Reviewed: 05/03/2015 Elsevier Interactive Patient Education  2019 [employer].

## 2018-10-10 NOTE — Telephone Encounter (Signed)
During AWV, pt's BP read 180/120. After waiting 5 min. And rechecking BP went down to 166/110, and author rechecked second time later in visit with similar reading. Pt. Stated he has not been taking his losartan because he misplaced it during his move. Pt. denies HA, vision changes, or pain. All other VS stable. Dr. Elease Hashimoto made aware of readings and refill of losartan sent to pt's desired pharmacy. Author instructed pt., per Dr. Erick Blinks advice, to restart losartan tonight, and monitor BP at home. Pt. Instructed to go to ED if symptoms start or BP consistently reads above 150/90. Physical appointment made with Dr. Maudie Mercury for 3/2 to address BP, as well as pt's main concern, his loss of voice, with moist productive cough, and recent unintentional weight loss X 7 months. Pt. states since June 2019, he has been evaluated by ENT and pulmonology. He has taken abx as prescribed, but symptoms persist. Pt. states the only thing he has noticed has helped his symptoms is taking large doses of vitamin D (taking 50000 units daily), and his move away from a moldy residence. Pt. has not seen an allergist yet and was wondering if he should at this point, although is growing weary of seeing doctors. Routed to PCP as FYI.

## 2018-10-13 NOTE — Telephone Encounter (Signed)
Author phoned pt. to relay Dr. Julianne Rice message, no answer. Author left detailed VM and reminded him of upcoming appointment with Dr. Maudie Mercury 3/3 at 1130AM. Referral to allergist placed.

## 2018-10-14 ENCOUNTER — Ambulatory Visit (INDEPENDENT_AMBULATORY_CARE_PROVIDER_SITE_OTHER): Payer: Medicare HMO | Admitting: Family Medicine

## 2018-10-14 ENCOUNTER — Encounter: Payer: Self-pay | Admitting: Family Medicine

## 2018-10-14 VITALS — BP 150/80 | HR 95 | Temp 98.4°F | Ht 70.0 in | Wt 210.8 lb

## 2018-10-14 DIAGNOSIS — I10 Essential (primary) hypertension: Secondary | ICD-10-CM

## 2018-10-14 DIAGNOSIS — R490 Dysphonia: Secondary | ICD-10-CM

## 2018-10-14 DIAGNOSIS — K219 Gastro-esophageal reflux disease without esophagitis: Secondary | ICD-10-CM

## 2018-10-14 DIAGNOSIS — G4730 Sleep apnea, unspecified: Secondary | ICD-10-CM | POA: Diagnosis not present

## 2018-10-14 DIAGNOSIS — R053 Chronic cough: Secondary | ICD-10-CM

## 2018-10-14 DIAGNOSIS — E041 Nontoxic single thyroid nodule: Secondary | ICD-10-CM | POA: Diagnosis not present

## 2018-10-14 DIAGNOSIS — R05 Cough: Secondary | ICD-10-CM | POA: Diagnosis not present

## 2018-10-14 DIAGNOSIS — E785 Hyperlipidemia, unspecified: Secondary | ICD-10-CM

## 2018-10-14 MED ORDER — LOSARTAN POTASSIUM 50 MG PO TABS: 50.0000 mg | ORAL_TABLET | Freq: Every day | ORAL | 1 refills | Status: DC

## 2018-10-14 NOTE — Patient Instructions (Signed)
BEFORE YOU LEAVE: -CT report -labs -follow up: 1 month  Call your ear nose and throat and your lung specialists today about your ongoing symptoms and for follow up.  You refused an ultrasound for your thyroid today. Please see your ear nose and throat specialist promptly about this and let us know if you change your mind.  Restart your losartan and take daily.  Eat a healthy low sugar diet and get regular exercise.

## 2018-10-14 NOTE — Progress Notes (Signed)
HPI:  Using dictation device. Unfortunately this device frequently misinterprets words/phrases.  Brett Tate. is a pleasant 72 yo here for follow up. Has PMH significant for HTN, Hyperlipidemia, sleep apnea, hx prostate cancer and recently a chronic cough and laryngitis. Reports ran out of losartan a few weeks ago. Reports has seen ENT (Dr. Wilburn Cornelia) and pulmonology with endoscopy, CT scan, bronchoscopy and many meds but not improvement in productive cough and hoarseness. Had thyroid nodule on CT. We recommended eval for this, but he did not come here. Seeing ENT. Reports ENT told him does not need [place] and nodule not of concern. Has emphysema on CT. He actually has not follow up with either specialist in months. Per notes nexium helped. I dont see recent PFTs and not on any meds for emphysema. He admits nexium helps. Denies SOB, DOE, hemoptysis, change in symptoms.  ROS: See pertinent positives and negatives per HPI.  Past Medical History:  Diagnosis Date  . Chronic diarrhea   . Dyslipidemia   . Elevated prostate specific antigen (PSA)   . Hearing difficulty of both ears    wears hearing aids  . History of tobacco use   . Hypertrophy of prostate without urinary obstruction and other lower urinary tract symptoms (LUTS)   . Osteoarthritis   . Prostate cancer ([place]) 11/16/2012   T1c Gleason 6 prostate cancer  . Sleep apnea    CPAP Machine   . Unspecified intestinal obstruction     Past Surgical History:  Procedure Laterality Date  . ANKLE SURGERY    . APPENDECTOMY  1981   Also had intestinal tumor removed  . COLON RESECTION  1980   fibroid  . KNEE ARTHROSCOPY W/ MENISCAL REPAIR  2012   left knee  . PROSTATE BIOPSY  11/16/2012    Family History  Problem Relation Age of Onset  . Irritable bowel syndrome Mother   . Arthritis Other   . Colon cancer Maternal Grandfather   . Heart disease Maternal Grandfather   . Cancer Neg Hx     SOCIAL HX: see hpi   Current  Outpatient Medications:  .  albuterol (PROVENTIL HFA;VENTOLIN HFA) 108 (90 Base) MCG/ACT inhaler, Inhale 2 puffs into the lungs every 6 (six) hours as needed., Disp: 1 Inhaler, Rfl: 0 .  AMBULATORY NON FORMULARY MEDICATION, CPAP MACHINE, Disp: , Rfl:  .  dicyclomine (BENTYL) 10 MG capsule, TAKE 1 CAPSULE BY MOUTH 3 TIMES DAILY AS NEEDED, Disp: 90 capsule, Rfl: 2 .  esomeprazole (NEXIUM) 40 MG capsule, Take 1 capsule (40 mg total) by mouth daily., Disp: 30 capsule, Rfl: 3 .  fluticasone (FLONASE) 50 MCG/ACT nasal spray, Place 2 sprays into both nostrils daily., Disp: 16 g, Rfl: 6 .  Fluticasone Propionate, Inhal, 250 MCG/BLIST AEPB, Inhale 1 puff into the lungs 2 (two) times daily., Disp: 60 each, Rfl: 5 .  Iodine Strong, Lugols, (IODINE STRONG PO), Take by mouth., Disp: , Rfl:  .  losartan (COZAAR) 50 MG tablet, Take 1 tablet (50 mg total) by mouth daily., Disp: 90 tablet, Rfl: 1 .  modafinil (PROVIGIL) 100 MG tablet, Take 100 mg by mouth daily., Disp: , Rfl:  .  Vitamin D, Ergocalciferol, (DRISDOL) 50000 units CAPS capsule, Take 1 capsule (50,000 Units total) by mouth every 7 (seven) days. (Patient taking differently: Take 50,000 Units by mouth daily. ), Disp: 12 capsule, Rfl: 0  EXAM:  Vitals:   10/14/18 1152  BP: (!) 150/80  Pulse: 95  Temp: 98.4 F (36.9  C)  SpO2: 97%    Body mass index is 30.25 kg/m.  GENERAL: vitals reviewed and listed above, alert, oriented, appears well hydrated and in no acute distress  HEENT: atraumatic, conjunttiva clear, no obvious abnormalities on inspection of external nose and ears  NECK: no obvious masses on inspection  LUNGS: clear to auscultation bilaterally, no wheezes, rales or rhonchi, good air movement  CV: HRRR, no peripheral edema  MS: moves all extremities without noticeable abnormality  PSYCH: pleasant and cooperative, no obvious depression or anxiety  ASSESSMENT AND PLAN:  Discussed the following assessment and plan:  Essential  hypertension - Plan: Basic metabolic panel, CBC, Hemoglobin A1c, losartan (COZAAR) 50 MG tablet  Hyperlipidemia, unspecified hyperlipidemia type  Sleep apnea, unspecified type  Gastroesophageal reflux disease, esophagitis presence not specified  Thyroid nodule - Plan: TSH  Chronic cough  Hoarseness - Plan: TSH  -restart BP medication and labs per orders -advise cont ppi for now and needs to follow up with pulm about the cough/emphysema and ENT about the hoarseness thyroid nodule. He refused thyroid US here. Printed CT report and advised he take to appt with specialist and review further, let them know his symptoms, follow up per their recs and if doesn't improve. He agrees to call them today. -Patient advised to return or notify a doctor immediately if symptoms worsen or persist or new concerns arise.  Patient Instructions  BEFORE YOU LEAVE: -CT report -labs -follow up: 1 month  Call your ear nose and throat and your lung specialists today about your ongoing symptoms and for follow up.  You refused an ultrasound for your thyroid today. Please see your ear nose and throat specialist promptly about this and let us know if you change your mind.  Restart your losartan and take daily.  Eat a healthy low sugar diet and get regular exercise.   Lucretia Kern, DO

## 2018-10-14 NOTE — Telephone Encounter (Signed)
Forms given back to the pt at his appt today.

## 2018-11-10 ENCOUNTER — Encounter: Payer: Medicare HMO | Admitting: Family Medicine

## 2018-11-28 ENCOUNTER — Ambulatory Visit: Payer: Medicare HMO | Admitting: Allergy

## 2018-11-28 ENCOUNTER — Other Ambulatory Visit: Payer: Self-pay

## 2018-11-28 ENCOUNTER — Encounter: Payer: Self-pay | Admitting: Allergy

## 2018-11-28 VITALS — BP 160/110 | HR 99 | Temp 98.2°F | Resp 18 | Ht 69.0 in | Wt 217.4 lb

## 2018-11-28 DIAGNOSIS — R062 Wheezing: Secondary | ICD-10-CM | POA: Diagnosis not present

## 2018-11-28 DIAGNOSIS — J432 Centrilobular emphysema: Secondary | ICD-10-CM

## 2018-11-28 DIAGNOSIS — J3089 Other allergic rhinitis: Secondary | ICD-10-CM

## 2018-11-28 DIAGNOSIS — G4733 Obstructive sleep apnea (adult) (pediatric): Secondary | ICD-10-CM

## 2018-11-28 DIAGNOSIS — K219 Gastro-esophageal reflux disease without esophagitis: Secondary | ICD-10-CM

## 2018-11-28 DIAGNOSIS — I10 Essential (primary) hypertension: Secondary | ICD-10-CM

## 2018-11-28 MED ORDER — FLUTICASONE PROPIONATE 93 MCG/ACT NA EXHU: 2.0000 | INHALANT_SUSPENSION | Freq: Two times a day (BID) | NASAL | 1 refills | Status: DC

## 2018-11-28 MED ORDER — DEXLANSOPRAZOLE 30 MG PO CPDR: DELAYED_RELEASE_CAPSULE | ORAL | 5 refills | Status: DC

## 2018-11-28 MED ORDER — BUDESONIDE-FORMOTEROL FUMARATE 160-4.5 MCG/ACT IN AERO: INHALATION_SPRAY | RESPIRATORY_TRACT | 5 refills | Status: DC

## 2018-11-28 MED ORDER — MONTELUKAST SODIUM 10 MG PO TABS: ORAL_TABLET | ORAL | 5 refills | Status: DC

## 2018-11-28 NOTE — Progress Notes (Signed)
New Patient Note  RE: Brett Tate. MRN: [id number] DOB: 1945-11-03 Date of Office Visit: 11/28/2018  Referring provider: Lucretia Kern, DO Primary care provider: Lucretia Kern, DO  Chief Complaint: cough and nasal drainage  History of present illness: Brett Tate. is a 73 y.o. male presenting today for consultation for cough, nasal congestion, sneezing, and thick post nasal drainage which he reports began in June 2019. He has previously seen an ENT specialist who recommended a proton pump inhibitor. He is currently taking Allegra once a day, Flonase, and saline nasal spray. He reports that he has tried all of the over the counter antihistamines and steroid nasal sprays with no relief of symptoms. In addition he has had completed azithromycin and Augmentin with no improvement of his symptoms.  He denies reflux or heartburn and has been taking Nexium 40 mg once a day for about one month with no improvement of his cough which is reported as clear thick clear mucus. He reports shortness of breath and wheeze which is worse with activity. He last saw a pulmonologist in October 2019 and did not follow up with this specialist. He reports that he took Advair with no relief of symptoms. He moved to [place] from [place] about 6-7 years ago. He has recently moved into a different apartment with no mold and reports his symptoms have improved a little since the move. He reports using a CPAP that he brought with him when he moved 6-7 years ago and has maintained the previous settings. His current medications are listed in the chart.    Review of systems: Review of Systems  Constitutional: Negative for fever.  HENT: Positive for congestion and sore throat.   Eyes: Negative.   Respiratory: Positive for cough, shortness of breath and wheezing.   Cardiovascular: Negative.   Gastrointestinal: Negative for heartburn.  Musculoskeletal: Negative.   Skin: Negative.   Neurological:  Negative.   Psychiatric/Behavioral: Negative.     All other systems negative unless noted above in HPI  Past medical history: Past Medical History:  Diagnosis Date  . Chronic diarrhea   . Dyslipidemia   . Elevated prostate specific antigen (PSA)   . Hearing difficulty of both ears    wears hearing aids  . History of tobacco use   . Hypertrophy of prostate without urinary obstruction and other lower urinary tract symptoms (LUTS)   . Osteoarthritis   . Prostate cancer ([place]) 11/16/2012   T1c Gleason 6 prostate cancer  . Sleep apnea    CPAP Machine   . Unspecified intestinal obstruction     Past surgical history: Past Surgical History:  Procedure Laterality Date  . ANKLE SURGERY    . APPENDECTOMY  1981   Also had intestinal tumor removed  . COLON RESECTION  1980   fibroid  . KNEE ARTHROSCOPY W/ MENISCAL REPAIR  2012   left knee  . PROSTATE BIOPSY  11/16/2012    Family history:  Family History  Problem Relation Age of Onset  . Irritable bowel syndrome Mother   . Arthritis Other   . Colon cancer Maternal Grandfather   . Heart disease Maternal Grandfather   . Cancer Neg Hx     Social history:  Tobacco Use  . Smoking status: Former Smoker    Packs/day: 2.00    Years: 43.00    Pack years: 86.00    Types: Cigarettes    Last attempt to quit: 08/13/2008    Years since  quitting: 10.2  . Smokeless tobacco: Never Used   Environmental history: He lives in an apartment with carpeted flooring throughout. Heating is electric and cooling is central. There is one dog located in the home and cats are located outside the home. There is no concern for roaches in the home. There are no dust mite free covers on the mattress or pillows. There is no concern for tobacco smoke, chemicals, fumes, or dust in the home. 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   Medication List: Allergies as of 11/28/2018   No Known Allergies     Medication List       Accurate as of November 28, 2018  4:30 PM. Always use your most recent med list.        albuterol 108 (90 Base) MCG/ACT inhaler Commonly known as:  VENTOLIN HFA Inhale 2 puffs into the lungs every 6 (six) hours as needed.   AMBULATORY NON FORMULARY MEDICATION CPAP MACHINE   budesonide-formoterol 160-4.5 MCG/ACT inhaler Commonly known as:  SYMBICORT Inhale 2 puffs into the lungs twice daily   Dexlansoprazole 30 MG capsule Take 1 tablet by mouth once daily   dicyclomine 10 MG capsule Commonly known as:  BENTYL TAKE 1 CAPSULE BY MOUTH 3 TIMES DAILY AS NEEDED   esomeprazole 40 MG capsule Commonly known as:  NexIUM Take 1 capsule (40 mg total) by mouth daily.   fluticasone 50 MCG/ACT nasal spray Commonly known as:  FLONASE Place 2 sprays into both nostrils daily.   Fluticasone Propionate 93 MCG/ACT Exhu Commonly known as:  Xhance Place 2 sprays into both nostrils 2 (two) times daily.   Fluticasone Propionate (Inhal) 250 MCG/BLIST Aepb Inhale 1 puff into the lungs 2 (two) times daily.   IODINE STRONG PO Take by mouth.   losartan 50 MG tablet Commonly known as:  COZAAR Take 1 tablet (50 mg total) by mouth daily.   modafinil 100 MG tablet Commonly known as:  PROVIGIL Take 100 mg by mouth daily.   montelukast 10 MG tablet Commonly known as:  SINGULAIR Take 1 tablet by mouth once daily   Vitamin D (Ergocalciferol) 1.25 MG (50000 UT) Caps capsule Commonly known as:  DRISDOL Take 1 capsule (50,000 Units total) by mouth every 7 (seven) days.       Known medication allergies: No Known Allergies   Physical examination: Blood pressure (!) 160/110, pulse 99, temperature 98.2 F (36.8 C), temperature source Tympanic, resp. rate 18, height 5\' 9"  (1.753 m), weight 217 lb 6.4 oz (98.6 kg), SpO2 94 %.  General: Alert, interactive, in no acute distress. HEENT: TMs pearly gray, turbinates markedly edematous and pale with thick discharge, post-pharynx markedly erythematous with cobblestoning present.  No exudate noted. Neck: Supple without lymphadenopathy. Lungs: Rhonchi that cleared with cough. Bilateral expiratory wheeze.. {no increased work of breathing. CV: Normal S1, S2 without murmurs. Abdomen: Nondistended, nontender. Skin: Warm and dry, without lesions or rashes. Extremities:  No clubbing, cyanosis or edema. Neuro:   Grossly intact.  Diagnositics/Labs: Labs: Environmental allergy panel drawn today.  Allergy testing: Deferred due to recent antihistamine use.  Assessment and plan:   Allergic rhinitis Begin Xhance 2 sprays twice a day for nasal congestion Begin montelukast 10 mg once a day for nasal symptoms Continue nasal saline rinses once a day. Use this before Xhance spray We will draw blood to help determine your allergies  Reflux Stop Nexium and begin Dexilant 30 mg once a day to decrease reflux. This may  help your breathing Continue lifestyle changes as you have been  Wheezing/Emphysema Begin Symbicort 160 mg 2 puffs twice a day to prevent cough and wheeze as a therapeutic trial Follow up with your pulmonologist to manage your emphysema and your CPAP settings  Hypertension Patient aware of blood pressure reading in the office today Follow up with your primary care provider for your blood pressure management  Call the clinic if this treatment plan is not working well for you  Follow up in 1 month or sooner if needed  Thank you for the opportunity to care for this patient.  Please do not hesitate to contact me with questions.  Gareth Morgan, FNP [facility]  Attestation: I appreciate the opportunity to take part in [place] care. Please do not hesitate to contact me with questions.  I performed/discussed the history and physical examination of the patient as well as management with NP Laia Wiley. I reviewed the NP's note and agree with the documented findings and plan of care with following  additions/exceptions:  Chronic cough likely with large component of post-nasal drainage and silent reflux.  He has had centrilobular emphysematous changes on chest CT most like due to his previous smoking history and thus recommend he have this followed by his pulmonologist.   Sincerely,   Prudy Feeler, MD Allergy/Immunology [facility]

## 2018-11-28 NOTE — Patient Instructions (Addendum)
Allergic rhinitis Begin Xhance 2 sprays twice a day for nasal congestion Begin montelukast 10 mg once a day for nasal symptoms Continue nasal saline rinses once a day. Use this before Xhance spray We will draw blood to help determine your allergies  Reflux Stop Nexium and begin Dexilant 30 mg once a day to decrease reflux. This may help your breathing Continue lifestyle changes as you have been  Wheezing/Emphysema Begin Symbicort 160 mg 2 puffs twice a day to prevent cough and wheeze as a therapeutic trial Follow up with your pulmonologist to manage your emphysema and your CPAP settings  Hypertension Patient aware of blood pressure reading in the office today Follow up with your primary care provider for your blood pressure management  Call the clinic if this treatment plan is not working well for you  Follow up in 1 month or sooner if needed   Lifestyle Changes for Controlling GERD When you have GERD, stomach acid feels as if it's backing up toward your mouth. Whether or not you take medication to control your GERD, your symptoms can often be improved with lifestyle changes.   Raise Your Head  Reflux is more likely to strike when you're lying down flat, because stomach fluid can  flow backward more easily. Raising the head of your bed 4-6 inches can help. To do this:  Slide blocks or books under the legs at the head of your bed. Or, place a wedge under  the mattress. Many foam stores can make a suitable wedge for you. The wedge  should run from your waist to the top of your head.  Don't just prop your head on several pillows. This increases pressure on your  stomach. It can make GERD worse.  Watch Your Eating Habits Certain foods may increase the acid in your stomach or relax the lower esophageal sphincter, making GERD more likely. It's best to avoid the following:  Coffee, tea, and carbonated drinks (with and without caffeine)  Fatty, fried, or spicy food  Mint,  chocolate, onions, and tomatoes  Any other foods that seem to irritate your stomach or cause you pain  Relieve the Pressure  Eat smaller meals, even if you have to eat more often.  Don't lie down right after you eat. Wait a few hours for your stomach to empty.  Avoid tight belts and tight-fitting clothes.  Lose excess weight.  Tobacco and Alcohol  Avoid smoking tobacco and drinking alcohol. They can make GERD symptoms worse.

## 2018-11-30 LAB — ALLERGENS W/TOTAL IGE AREA 2
Alternaria Alternata IgE: <0.1 kU/L
Aspergillus Fumigatus IgE: <0.1 kU/L
Bermuda Grass IgE: <0.1 kU/L
Cat Dander IgE: 0.17 kU/L — AB
Cedar, Mountain IgE: <0.1 kU/L
Cladosporium Herbarum IgE: <0.1 kU/L
Cockroach, German IgE: 0.11 kU/L — AB
Common Silver Birch IgE: <0.1 kU/L
Cottonwood IgE: <0.1 kU/L
D Farinae IgE: <0.1 kU/L
D Pteronyssinus IgE: <0.1 kU/L
Dog Dander IgE: <0.1 kU/L
Elm, American IgE: <0.1 kU/L
IgE (Immunoglobulin E), Serum: 451 IU/mL (ref 6–495)
Johnson Grass IgE: <0.1 kU/L
Maple/Box Elder IgE: <0.1 kU/L
Mouse Urine IgE: <0.1 kU/L
Oak, White IgE: <0.1 kU/L
Pecan, Hickory IgE: <0.1 kU/L
Penicillium Chrysogen IgE: <0.1 kU/L
Pigweed, Rough IgE: <0.1 kU/L
Ragweed, Short IgE: <0.1 kU/L
Sheep Sorrel IgE Qn: <0.1 kU/L
Timothy Grass IgE: 0.75 kU/L — AB
White Mulberry IgE: <0.1 kU/L

## 2018-12-01 NOTE — Progress Notes (Signed)
Can you please tell this patient his lab testing indicates low or equivocal sensitivities to cat dander and cockroach and a moderate sensitivity to Timothy grass pollen. Please send avoidance measures to him. If he is not getting any relief from his medications along with avoidance measures, have him return for skin testing. Thank you

## 2018-12-04 ENCOUNTER — Telehealth: Payer: Self-pay

## 2018-12-04 NOTE — Telephone Encounter (Signed)
MCR doesn't cover Truett Perna, needs alternative. Kayla advise with [place]?

## 2018-12-05 NOTE — Telephone Encounter (Signed)
He was also given an nasacort to try thus he can get that OTC once his sample runs out

## 2018-12-05 NOTE — Telephone Encounter (Signed)
Medicare will not cover Xhance. Please advise on an alternative and thank you.

## 2018-12-31 ENCOUNTER — Ambulatory Visit: Payer: Medicare HMO | Admitting: Allergy

## 2018-12-31 ENCOUNTER — Other Ambulatory Visit: Payer: Self-pay

## 2018-12-31 MED ORDER — ALBUTEROL SULFATE HFA 108 (90 BASE) MCG/ACT IN AERS: 2.0000 | INHALATION_SPRAY | Freq: Four times a day (QID) | RESPIRATORY_TRACT | 1 refills | Status: DC | PRN

## 2018-12-31 MED ORDER — DEXLANSOPRAZOLE 30 MG PO CPDR: DELAYED_RELEASE_CAPSULE | ORAL | 5 refills | Status: DC

## 2019-01-01 ENCOUNTER — Other Ambulatory Visit: Payer: Self-pay

## 2019-01-01 MED ORDER — ALBUTEROL SULFATE HFA 108 (90 BASE) MCG/ACT IN AERS: 2.0000 | INHALATION_SPRAY | Freq: Four times a day (QID) | RESPIRATORY_TRACT | 1 refills | Status: DC | PRN

## 2019-01-01 MED ORDER — DEXLANSOPRAZOLE 30 MG PO CPDR: DELAYED_RELEASE_CAPSULE | ORAL | 5 refills | Status: DC

## 2019-01-01 NOTE — Progress Notes (Signed)
Patient called and stated that his medications were sent to wrong pharmacy. Refills sent to pharmacy patient requested.

## 2019-01-03 ENCOUNTER — Other Ambulatory Visit: Payer: Self-pay | Admitting: Family Medicine

## 2019-01-03 DIAGNOSIS — K589 Irritable bowel syndrome without diarrhea: Secondary | ICD-10-CM

## 2019-03-04 ENCOUNTER — Ambulatory Visit: Payer: Self-pay | Admitting: Allergy

## 2019-03-05 ENCOUNTER — Encounter: Payer: Self-pay | Admitting: Allergy

## 2019-03-05 ENCOUNTER — Ambulatory Visit (INDEPENDENT_AMBULATORY_CARE_PROVIDER_SITE_OTHER): Payer: Medicare HMO | Admitting: Allergy

## 2019-03-05 ENCOUNTER — Other Ambulatory Visit: Payer: Self-pay

## 2019-03-05 DIAGNOSIS — R062 Wheezing: Secondary | ICD-10-CM

## 2019-03-05 DIAGNOSIS — J3089 Other allergic rhinitis: Secondary | ICD-10-CM

## 2019-03-05 DIAGNOSIS — J432 Centrilobular emphysema: Secondary | ICD-10-CM | POA: Diagnosis not present

## 2019-03-05 DIAGNOSIS — K219 Gastro-esophageal reflux disease without esophagitis: Secondary | ICD-10-CM | POA: Diagnosis not present

## 2019-03-05 MED ORDER — ALBUTEROL SULFATE HFA 108 (90 BASE) MCG/ACT IN AERS: 2.0000 | INHALATION_SPRAY | Freq: Four times a day (QID) | RESPIRATORY_TRACT | 1 refills | Status: DC | PRN

## 2019-03-05 MED ORDER — TRELEGY ELLIPTA 100-62.5-25 MCG/INH IN AEPB: 1.0000 | INHALATION_SPRAY | Freq: Every day | RESPIRATORY_TRACT | 5 refills | Status: DC

## 2019-03-05 NOTE — Patient Instructions (Addendum)
Allergic rhinitis Continue avoidance measures for cockroach, grass pollen and cat dander He will continue use of his silver based nasal spray.  Advised if this becomes ineffective to let us know and would recommend Astelin+fluticasone at that time. Recommend use of nasal saline rinses once a day to help keep nose/sinuses flushed out and moisturized  Reflux Continue Dexilant 30 mg once a day to decrease reflux.  Continue lifestyle changes as you have been  Wheezing/Emphysema Will change to Trelegy (if covered) 1 puff once a day which is a triple medication inhaler for COPD management. If Trelegy is not covered then will have him continue on Symbicort 160mg .   Follow up with your pulmonologist to manage your emphysema and your CPAP settings   Follow up in 4  month or sooner if needed

## 2019-03-05 NOTE — Progress Notes (Signed)
RE: Brett Tate. MRN: [id number] DOB: May 03, 1946 Date of Telemedicine Visit: 03/05/2019  Referring provider: Lucretia Kern, DO Primary care provider: Patient, No Pcp Per  Chief Complaint: Allergic Rhinitis    Telemedicine Follow Up Visit via Telephone: I connected with Brett Tate for a follow up on 03/05/19 by telephone and verified that I am speaking with the correct person using two identifiers.   I discussed the limitations, risks, security and privacy concerns of performing an evaluation and management service by telephone and the availability of in person appointments. I also discussed with the patient that there may be a patient responsible charge related to this service. The patient expressed understanding and agreed to proceed.  Patient is at home.  Provider is at the office.  Visit start time: 1722 Visit end time: [place] consent/check in by: Brett Tate Medical consent and medical assistant/nurse: Brett Tate  History of Present Illness: He is a 73 y.o. male, who is being followed for allergic rhinitis, reflux and wheezing/emphysema. His previous allergy office visit was on 11/28/2018 with FNP Ambs and Dr. Nelva Bush.   He states he ran out of Symbicort and is having more wheezing and productive cough and is requiring more albuterol use to the point that he is about to run out of albuterol.  He does have a pulmonologist in [place] area but he does not know the name of this person and has not seen since his visit in April.   He does use CPAP nightly for OSA but states he got his machine from a friend and has not had anyone adjust settings or ensure his settings are appropriate.    He states with his nasal symptoms that he is using a holistic nasal of "[place] silver" spray that dries up his nasal drainage.  He states it is working well.  He states the Truett Perna was too expensive at $300.  He does not recall getting a Xhance sample at last visit thus states he has  not used it.    He states dexilant is working well to help control his reflux.    Assessment and Plan: Brett Tate is a 73 y.o. male with:   Allergic rhinitis Continue avoidance measures for cockroach, grass pollen and cat dander He will continue use of his silver based nasal spray.  Advised if this becomes ineffective to let us know and would recommend Astelin+fluticasone at that time. Recommend use of nasal saline rinses once a day to help keep nose/sinuses flushed out and moisturized  Reflux Continue Dexilant 30 mg once a day to decrease reflux.  Continue lifestyle changes as you have been  Wheezing/Emphysema Will change to Trelegy (if covered) 1 puff once a day which is a triple medication inhaler for COPD management. If Trelegy is not covered then will have him continue on Symbicort 160mg .   Follow up with your pulmonologist to manage your emphysema and your CPAP settings   Follow up in  4 month or sooner if needed  Diagnostics: None.  Medication List:  Current Outpatient Medications  Medication Sig Dispense Refill   albuterol (VENTOLIN HFA) 108 (90 Base) MCG/ACT inhaler Inhale 2 puffs into the lungs every 6 (six) hours as needed. 1 Inhaler 1   AMBULATORY NON FORMULARY MEDICATION CPAP MACHINE     Dexlansoprazole 30 MG capsule Take 1 tablet by mouth once daily 30 capsule 5   dicyclomine (BENTYL) 10 MG capsule TAKE 1 CAPSULE BY MOUTH THREE TIMES A DAY AS NEEDED 90 capsule  0   fluticasone (FLONASE) 50 MCG/ACT nasal spray Place 2 sprays into both nostrils daily. 16 g 6   Fluticasone Propionate, Inhal, 250 MCG/BLIST AEPB Inhale 1 puff into the lungs 2 (two) times daily. 60 each 5   Iodine Strong, Lugols, (IODINE STRONG PO) Take by mouth.     losartan (COZAAR) 50 MG tablet Take 1 tablet (50 mg total) by mouth daily. 90 tablet 1   modafinil (PROVIGIL) 100 MG tablet Take 100 mg by mouth daily.     montelukast (SINGULAIR) 10 MG tablet Take 1 tablet by mouth once daily 30  tablet 5   Vitamin D, Ergocalciferol, (DRISDOL) 50000 units CAPS capsule Take 1 capsule (50,000 Units total) by mouth every 7 (seven) days. (Patient taking differently: Take 50,000 Units by mouth daily. ) 12 capsule 0   budesonide-formoterol (SYMBICORT) 160-4.5 MCG/ACT inhaler Inhale 2 puffs into the lungs twice daily (Patient not taking: Reported on 03/05/2019) 1 Inhaler 5   No current facility-administered medications for this visit.    Allergies: No Known Allergies I reviewed his past medical history, social history, family history, and environmental history and no significant changes have been reported from previous visit on 11/28/2018.  Review of Systems  Constitutional: Negative for chills and fever.  HENT: Negative for congestion, postnasal drip, rhinorrhea, sinus pressure, sinus pain and sneezing.   Eyes: Negative for discharge, redness and itching.  Respiratory: Positive for cough and wheezing.   Cardiovascular: Negative.   Gastrointestinal: Negative.   Musculoskeletal: Negative for myalgias.  Skin: Negative for rash.  Neurological: Negative for headaches.   Objective: Physical Exam Not obtained as encounter was done via telephone.   Previous notes and tests were reviewed.  I discussed the assessment and treatment plan with the patient. The patient was provided an opportunity to ask questions and all were answered. The patient agreed with the plan and demonstrated an understanding of the instructions.   The patient was advised to call back or seek an in-person evaluation if the symptoms worsen or if the condition fails to improve as anticipated.  I provided 25 minutes of non-face-to-face time during this encounter.  It was my pleasure to participate in Brett Tate care today. Please feel free to contact me with any questions or concerns.   Sincerely,  Allia Wiltsey Charmian Muff, MD

## 2019-03-10 ENCOUNTER — Other Ambulatory Visit: Payer: Self-pay | Admitting: Allergy

## 2019-04-06 ENCOUNTER — Telehealth: Payer: Self-pay

## 2019-04-06 NOTE — Telephone Encounter (Signed)
Patient is calling stating he wants a cheaper inhaler. He is paying $50 For Trelegy. Per Dr Julaine Hua note she states if trelegy isnt't covered then switch the patient back to Symbicort. I asked the patient if symbicort was cheaper and he didn't remember ever being on symbicort. He also was requesting to see if Anoro would be covered.  Please Advise.

## 2019-04-07 NOTE — Telephone Encounter (Signed)
lmom for patient to call back 

## 2019-04-07 NOTE — Telephone Encounter (Signed)
Yes we gave him a symbicort sample at this first visit to try.     We could try Anoro (as long as he is not milk allergic) however this is a 2 medication regimen and doesn't include the steroid component.  Thus if Anoro is covered would also recommend he use a plan steroid inhaler like Qvar 41mcg or Flovent 262mcg with it.     If Anoro is not covered then to get a comparable regimen to Trelegy we can Symbicort 160mg  + Spiriva2.82mcg dose.

## 2019-04-07 NOTE — Telephone Encounter (Signed)
Dr. Padgett? 

## 2019-04-08 ENCOUNTER — Encounter: Payer: Self-pay | Admitting: Physical Therapy

## 2019-04-17 NOTE — Telephone Encounter (Signed)
Left message for patient to call the office about pick up a sample of Symbicort to try. Also sent patient a message via mychart to come pick up a sample. Sample of Symbicort was placed up front.

## 2019-05-15 ENCOUNTER — Telehealth: Payer: Self-pay

## 2019-05-15 DIAGNOSIS — R69 Illness, unspecified: Secondary | ICD-10-CM | POA: Diagnosis not present

## 2019-05-15 MED ORDER — ALBUTEROL SULFATE (2.5 MG/3ML) 0.083% IN NEBU: 2.5000 mg | INHALATION_SOLUTION | RESPIRATORY_TRACT | 1 refills | Status: DC | PRN

## 2019-05-15 MED ORDER — TRELEGY ELLIPTA 100-62.5-25 MCG/INH IN AEPB: 1.0000 | INHALATION_SPRAY | Freq: Every day | RESPIRATORY_TRACT | 5 refills | Status: DC

## 2019-05-15 NOTE — Telephone Encounter (Signed)
Pt calling asking for a nebulizer and a refill on Trelegy.  Okay to give Nebulizer per Dr Nelva Bush and to refill the Trelegy.  Also, Dr Nelva Bush instructed to give albuterol neb solution.

## 2019-05-18 DIAGNOSIS — J4520 Mild intermittent asthma, uncomplicated: Secondary | ICD-10-CM | POA: Diagnosis not present

## 2019-05-30 ENCOUNTER — Other Ambulatory Visit: Payer: Self-pay | Admitting: Family Medicine

## 2019-06-05 ENCOUNTER — Other Ambulatory Visit: Payer: Self-pay | Admitting: Family Medicine

## 2019-06-05 DIAGNOSIS — K589 Irritable bowel syndrome without diarrhea: Secondary | ICD-10-CM

## 2019-06-29 ENCOUNTER — Other Ambulatory Visit: Payer: Self-pay | Admitting: Family Medicine

## 2019-06-29 DIAGNOSIS — K589 Irritable bowel syndrome without diarrhea: Secondary | ICD-10-CM

## 2019-07-08 ENCOUNTER — Other Ambulatory Visit: Payer: Self-pay

## 2019-07-08 ENCOUNTER — Encounter: Payer: Self-pay | Admitting: Allergy

## 2019-07-08 ENCOUNTER — Ambulatory Visit: Payer: Medicare HMO | Admitting: Allergy

## 2019-07-08 VITALS — BP 132/80 | HR 91 | Temp 98.1°F | Resp 17

## 2019-07-08 DIAGNOSIS — J3089 Other allergic rhinitis: Secondary | ICD-10-CM | POA: Diagnosis not present

## 2019-07-08 DIAGNOSIS — R062 Wheezing: Secondary | ICD-10-CM | POA: Diagnosis not present

## 2019-07-08 DIAGNOSIS — J432 Centrilobular emphysema: Secondary | ICD-10-CM

## 2019-07-08 NOTE — Patient Instructions (Addendum)
Allergic rhinitis Continue avoidance measures for cockroach, grass pollen and cat dander He will continue use of his silver based nasal spray.  Advised if this becomes ineffective to let us know and would recommend Astelin+fluticasone at that time. Recommend use of nasal saline rinses once a day to help keep nose/sinuses flushed out and moisturized  Reflux Can use Dexilant 30 mg once a day as needed to manage reflux symptoms  Continue lifestyle changes as you have been  Wheezing/Emphysema Continue Trelegy 1 puff once a day which is a triple medication inhaler for COPD management. Have access to albuterol inhaler 2 puffs every 4-6 hours as needed for cough/wheeze/shortness of breath/chest tightness.  May use 15-20 minutes prior to activity.   Monitor frequency of use.    Asthma control goals:   Full participation in all desired activities (may need albuterol before activity)  Albuterol use two time or less a week on average (not counting use with activity)  Cough interfering with sleep two time or less a month  Oral steroids no more than once a year  No hospitalizations   Follow up in 4  month or sooner if needed

## 2019-07-08 NOTE — Progress Notes (Signed)
Follow-up Note  RE: Brett Tate. MRN: [id number] DOB: Oct 27, 1945 Date of Office Visit: 07/08/2019   History of present illness: Brett Tate. is a 73 y.o. male presenting today for follow-up of wheezing/emphysema, allergic rhinitis and reflux.  He last visit on 03/05/2019 via telemedicine.  He states he has not had any major health changes, surgeries or hospitalizations.  He states he has been doing well as he "does not do sick".  He states he takes his Trelegy about every other day when he remembers.  He denies using his rescue inhaler.  He denies any respiratory symptoms or nighttime awakenings.  He denies any ED or urgent care visits or any systemic steroid needs. With his allergies he states he has not had any issues and states he will use his nasal spray is sober based on occasion but states he has not needed to use this in quite some time. He states he has not had any issues with reflux and has not needed to use any Dexilant.  Review of systems: Review of Systems  Constitutional: Negative for chills, fever and malaise/fatigue.  HENT: Negative for congestion, ear discharge, nosebleeds, sinus pain and sore throat.   Eyes: Negative for pain, discharge and redness.  Respiratory: Negative for cough, sputum production, shortness of breath and wheezing.   Cardiovascular: Negative.   Gastrointestinal: Negative.   Musculoskeletal: Negative.   Skin: Negative for itching and rash.  Neurological: Negative.     All other systems negative unless noted above in HPI  Past medical/social/surgical/family history have been reviewed and are unchanged unless specifically indicated below.  No changes  Medication List: Current Outpatient Medications  Medication Sig Dispense Refill  . albuterol (PROVENTIL) (2.5 MG/3ML) 0.083% nebulizer solution Take 3 mLs (2.5 mg total) by nebulization every 4 (four) hours as needed for wheezing or shortness of breath. 75 mL 1  . albuterol  (VENTOLIN HFA) 108 (90 Base) MCG/ACT inhaler INHALE 2 PUFFS INTO LUNGS EVERY 6 HOURS AS NEEDED 9 g 1  . AMBULATORY NON FORMULARY MEDICATION CPAP MACHINE    . Dexlansoprazole 30 MG capsule Take 1 tablet by mouth once daily 30 capsule 5  . dicyclomine (BENTYL) 10 MG capsule TAKE 1 CAPSULE BY MOUTH THREE TIMES A DAY AS NEEDED 90 capsule 0  . fluticasone (FLONASE) 50 MCG/ACT nasal spray Place 2 sprays into both nostrils daily. 16 g 6  . Fluticasone Propionate, Inhal, 250 MCG/BLIST AEPB Inhale 1 puff into the lungs 2 (two) times daily. 60 each 5  . Fluticasone-Umeclidin-Vilant (TRELEGY ELLIPTA) 100-62.5-25 MCG/INH AEPB Inhale 1 puff into the lungs daily. 30 each 5  . Iodine Strong, Lugols, (IODINE STRONG PO) Take by mouth.    . losartan (COZAAR) 50 MG tablet Take 1 tablet (50 mg total) by mouth daily. 90 tablet 1  . modafinil (PROVIGIL) 100 MG tablet Take 100 mg by mouth daily.    . montelukast (SINGULAIR) 10 MG tablet TAKE 1 TABLET BY MOUTH EVERY DAY 90 tablet 1  . Vitamin D, Ergocalciferol, (DRISDOL) 50000 units CAPS capsule Take 1 capsule (50,000 Units total) by mouth every 7 (seven) days. (Patient taking differently: Take 50,000 Units by mouth daily. ) 12 capsule 0  . budesonide-formoterol (SYMBICORT) 160-4.5 MCG/ACT inhaler Inhale 2 puffs into the lungs twice daily (Patient not taking: Reported on 03/05/2019) 1 Inhaler 5   No current facility-administered medications for this visit.      Known medication allergies: No Known Allergies   Physical examination: Blood  pressure 132/80, pulse 91, temperature 98.1 F (36.7 C), temperature source Temporal, resp. rate 17, SpO2 95 %.  General: Alert, interactive, in no acute distress. HEENT: PERRLA, TMs pearly gray, turbinates minimally edematous without discharge, post-pharynx non erythematous. Neck: Supple without lymphadenopathy. Lungs: Clear to auscultation without wheezing, rhonchi or rales. {no increased work of breathing. CV: Normal S1, S2  without murmurs. Abdomen: Nondistended, nontender. Skin: Warm and dry, without lesions or rashes. Extremities:  No clubbing, cyanosis or edema. Neuro:   Grossly intact.  Diagnositics/Labs:  Spirometry: FEV1: 2.6L 86%, FVC: 3.31L 79%, ratio consistent with nonobstructive pattern for age  Assessment and plan:   Allergic rhinitis Continue avoidance measures for cockroach, grass pollen and cat dander He will continue use of his silver based nasal spray.  Advised if this becomes ineffective to let us know and would recommend Astelin+fluticasone at that time. Recommend use of nasal saline rinses once a day to help keep nose/sinuses flushed out and moisturized  Reflux Can use Dexilant 30 mg once a day as needed to manage reflux symptoms  Continue lifestyle changes as you have been  Wheezing/Emphysema Continue Trelegy 1 puff once a day which is a triple medication inhaler for COPD management. Have access to albuterol inhaler 2 puffs every 4-6 hours as needed for cough/wheeze/shortness of breath/chest tightness.  May use 15-20 minutes prior to activity.   Monitor frequency of use.    Asthma control goals:   Full participation in all desired activities (may need albuterol before activity)  Albuterol use two time or less a week on average (not counting use with activity)  Cough interfering with sleep two time or less a month  Oral steroids no more than once a year  No hospitalizations   Follow up in 4  month or sooner if needed  I appreciate the opportunity to take part in [place] care. Please do not hesitate to contact me with questions.  Sincerely,   Prudy Feeler, MD Allergy/Immunology [facility] [place] [facility] [place]

## 2019-07-30 ENCOUNTER — Other Ambulatory Visit: Payer: Self-pay | Admitting: Family Medicine

## 2019-07-30 DIAGNOSIS — K589 Irritable bowel syndrome without diarrhea: Secondary | ICD-10-CM

## 2019-09-24 ENCOUNTER — Other Ambulatory Visit: Payer: Self-pay | Admitting: Family Medicine

## 2019-09-24 DIAGNOSIS — I10 Essential (primary) hypertension: Secondary | ICD-10-CM

## 2019-09-28 ENCOUNTER — Other Ambulatory Visit: Payer: Self-pay | Admitting: *Deleted

## 2020-12-11 DEATH — deceased
